# Patient Record
Sex: Female | Born: 1960
Health system: Southern US, Community
[De-identification: ages and names within clinical notes are randomized; demographics above are authoritative.]

## PROBLEM LIST (undated history)

## (undated) DIAGNOSIS — K5792 Diverticulitis of intestine, part unspecified, without perforation or abscess without bleeding: Secondary | ICD-10-CM

## (undated) DIAGNOSIS — T7840XA Allergy, unspecified, initial encounter: Secondary | ICD-10-CM

## (undated) DIAGNOSIS — K219 Gastro-esophageal reflux disease without esophagitis: Secondary | ICD-10-CM

## (undated) DIAGNOSIS — K589 Irritable bowel syndrome without diarrhea: Secondary | ICD-10-CM

## (undated) DIAGNOSIS — T07XXXA Unspecified multiple injuries, initial encounter: Secondary | ICD-10-CM

## (undated) DIAGNOSIS — E739 Lactose intolerance, unspecified: Secondary | ICD-10-CM

## (undated) DIAGNOSIS — K631 Perforation of intestine (nontraumatic): Secondary | ICD-10-CM

## (undated) DIAGNOSIS — R42 Dizziness and giddiness: Secondary | ICD-10-CM

## (undated) DIAGNOSIS — M810 Age-related osteoporosis without current pathological fracture: Secondary | ICD-10-CM

## (undated) DIAGNOSIS — N951 Menopausal and female climacteric states: Secondary | ICD-10-CM

## (undated) DIAGNOSIS — K633 Ulcer of intestine: Secondary | ICD-10-CM

## (undated) HISTORY — DX: Ulcer of intestine: K63.3

## (undated) HISTORY — DX: Perforation of intestine (nontraumatic): K63.1

## (undated) HISTORY — DX: Gastro-esophageal reflux disease without esophagitis: K21.9

## (undated) HISTORY — DX: Dizziness and giddiness: R42

## (undated) HISTORY — DX: Menopausal and female climacteric states: N95.1

## (undated) HISTORY — PX: TONSILLECTOMY: SUR1361

## (undated) HISTORY — DX: Unspecified multiple injuries, initial encounter: T07.XXXA

## (undated) HISTORY — PX: ESOPHAGOGASTRODUODENOSCOPY: SHX1529

## (undated) HISTORY — DX: Age-related osteoporosis without current pathological fracture: M81.0

## (undated) HISTORY — PX: OTHER SURGICAL HISTORY: SHX169

## (undated) HISTORY — DX: Diverticulitis of intestine, part unspecified, without perforation or abscess without bleeding: K57.92

## (undated) HISTORY — DX: Allergy, unspecified, initial encounter: T78.40XA

## (undated) HISTORY — PX: COLONOSCOPY: SHX174

---

## 1998-04-19 ENCOUNTER — Other Ambulatory Visit: Admission: RE | Admit: 1998-04-19 | Discharge: 1998-04-19 | Payer: Self-pay | Admitting: Obstetrics & Gynecology

## 1999-07-08 ENCOUNTER — Other Ambulatory Visit: Admission: RE | Admit: 1999-07-08 | Discharge: 1999-07-08 | Payer: Self-pay | Admitting: Obstetrics & Gynecology

## 2002-06-28 ENCOUNTER — Other Ambulatory Visit: Admission: RE | Admit: 2002-06-28 | Discharge: 2002-06-28 | Payer: Self-pay | Admitting: Obstetrics & Gynecology

## 2002-11-02 ENCOUNTER — Other Ambulatory Visit: Admission: RE | Admit: 2002-11-02 | Discharge: 2002-11-02 | Payer: Self-pay | Admitting: Obstetrics & Gynecology

## 2002-11-14 ENCOUNTER — Encounter: Admission: RE | Admit: 2002-11-14 | Discharge: 2002-11-14 | Payer: Self-pay | Admitting: Orthopedic Surgery

## 2002-11-14 ENCOUNTER — Encounter: Payer: Self-pay | Admitting: Orthopedic Surgery

## 2003-10-16 ENCOUNTER — Other Ambulatory Visit: Admission: RE | Admit: 2003-10-16 | Discharge: 2003-10-16 | Payer: Self-pay | Admitting: Obstetrics and Gynecology

## 2004-12-10 ENCOUNTER — Other Ambulatory Visit: Admission: RE | Admit: 2004-12-10 | Discharge: 2004-12-10 | Payer: Self-pay | Admitting: Obstetrics & Gynecology

## 2010-11-03 ENCOUNTER — Encounter: Payer: Self-pay | Admitting: Gastroenterology

## 2011-08-30 ENCOUNTER — Encounter: Payer: Self-pay | Admitting: *Deleted

## 2011-08-30 ENCOUNTER — Emergency Department (HOSPITAL_COMMUNITY): Payer: BC Managed Care – PPO

## 2011-08-30 ENCOUNTER — Inpatient Hospital Stay (HOSPITAL_COMMUNITY)
Admission: EM | Admit: 2011-08-30 | Discharge: 2011-09-03 | DRG: 188 | Disposition: A | Discharge status: Home / Self Care | Payer: BC Managed Care – PPO | Attending: Internal Medicine | Admitting: Internal Medicine

## 2011-08-30 DIAGNOSIS — R109 Unspecified abdominal pain: Secondary | ICD-10-CM | POA: Diagnosis present

## 2011-08-30 DIAGNOSIS — E876 Hypokalemia: Secondary | ICD-10-CM | POA: Diagnosis present

## 2011-08-30 DIAGNOSIS — K639 Disease of intestine, unspecified: Secondary | ICD-10-CM | POA: Diagnosis present

## 2011-08-30 DIAGNOSIS — N390 Urinary tract infection, site not specified: Secondary | ICD-10-CM | POA: Diagnosis present

## 2011-08-30 DIAGNOSIS — K631 Perforation of intestine (nontraumatic): Principal | ICD-10-CM | POA: Diagnosis present

## 2011-08-30 DIAGNOSIS — K6389 Other specified diseases of intestine: Secondary | ICD-10-CM | POA: Diagnosis present

## 2011-08-30 HISTORY — DX: Lactose intolerance, unspecified: E73.9

## 2011-08-30 HISTORY — DX: Irritable bowel syndrome, unspecified: K58.9

## 2011-08-30 LAB — URINALYSIS, ROUTINE W REFLEX MICROSCOPIC
Bilirubin Urine: NEGATIVE
Glucose, UA: NEGATIVE mg/dL
Hgb urine dipstick: NEGATIVE
Ketones, ur: NEGATIVE mg/dL
Protein, ur: NEGATIVE mg/dL
Specific Gravity, Urine: 1.013 (ref 1.005–1.030)
Urobilinogen, UA: 1 mg/dL (ref 0.0–1.0)
pH: 7 (ref 5.0–8.0)

## 2011-08-30 LAB — DIFFERENTIAL
Basophils Absolute: 0 10*3/uL (ref 0.0–0.1)
Eosinophils Relative: 2 % (ref 0–5)
Lymphocytes Relative: 15 % (ref 12–46)
Monocytes Absolute: 0.9 10*3/uL (ref 0.1–1.0)
Monocytes Relative: 9 % (ref 3–12)

## 2011-08-30 LAB — BASIC METABOLIC PANEL
BUN: 10 mg/dL (ref 6–23)
CO2: 29 mEq/L (ref 19–32)
Calcium: 9.7 mg/dL (ref 8.4–10.5)
Chloride: 99 mEq/L (ref 96–112)
Creatinine, Ser: 0.52 mg/dL (ref 0.50–1.10)

## 2011-08-30 LAB — CBC
HCT: 39.5 % (ref 36.0–46.0)
Hemoglobin: 13.4 g/dL (ref 12.0–15.0)
MCHC: 33.9 g/dL (ref 30.0–36.0)
MCV: 92.1 fL (ref 78.0–100.0)
RDW: 12.6 % (ref 11.5–15.5)
WBC: 10 10*3/uL (ref 4.0–10.5)

## 2011-08-30 LAB — URINE MICROSCOPIC-ADD ON

## 2011-08-30 LAB — POCT PREGNANCY, URINE: Preg Test, Ur: NEGATIVE

## 2011-08-30 MED ORDER — POTASSIUM CHLORIDE 10 MEQ/100ML IV SOLN: 10.0000 meq | INTRAVENOUS | Status: AC

## 2011-08-30 MED ORDER — SODIUM CHLORIDE 0.9 % IV SOLN
INTRAVENOUS | Status: DC
  Administered 2011-08-30: 18:00:00 via INTRAVENOUS

## 2011-08-30 MED ORDER — CIPROFLOXACIN IN D5W 400 MG/200ML IV SOLN
400.0000 mg | Freq: Two times a day (BID) | INTRAVENOUS | Status: DC
  Administered 2011-08-30 – 2011-09-02 (×6): 400 mg via INTRAVENOUS
  Filled 2011-08-30 (×6): qty 200

## 2011-08-30 MED ORDER — IOHEXOL 300 MG/ML  SOLN
80.0000 mL | Freq: Once | INTRAMUSCULAR | Status: AC | PRN
  Administered 2011-08-30: 80 mL via INTRAVENOUS

## 2011-08-30 MED ORDER — METRONIDAZOLE IN NACL 5-0.79 MG/ML-% IV SOLN
500.0000 mg | Freq: Three times a day (TID) | INTRAVENOUS | Status: DC
  Administered 2011-08-30 – 2011-09-02 (×8): 500 mg via INTRAVENOUS
  Filled 2011-08-30 (×9): qty 100

## 2011-08-30 MED ORDER — MORPHINE SULFATE 2 MG/ML IJ SOLN
2.0000 mg | INTRAMUSCULAR | Status: DC | PRN
  Administered 2011-08-30: 2 mg via INTRAVENOUS
  Filled 2011-08-30: qty 1

## 2011-08-30 NOTE — ED Notes (Signed)
MD at bedside. 

## 2011-08-30 NOTE — ED Provider Notes (Signed)
History     CSN: [id number] Arrival date & time: 08/30/2011  4:11 PM   First MD Initiated Contact with Patient 08/30/11 1701      Chief Complaint  Patient presents with  . Abdominal Pain    (Consider location/radiation/quality/duration/timing/severity/associated sxs/prior treatment) Patient is a 50 y.o. female presenting with abdominal pain.  Abdominal Pain The primary symptoms of the illness include abdominal pain. The current episode started 2 days ago. The onset of the illness was gradual. The problem has not changed since onset. Associated with: There was no provocation. The patient states that she believes she is currently not pregnant. The patient has not had a change in bowel habit. Additional symptoms associated with the illness include anorexia. Symptoms associated with the illness do not include chills, heartburn, urgency, hematuria, frequency or back pain. Significant associated medical issues do not include PUD, GERD, inflammatory bowel disease or HIV. Associated medical issues comments: She has chronic gastric discomfort, and frequent stooling. She has been evaluated by GI in the past for same.   Patient's symptoms have been ongoing for several days and persistent in nature. She ate well until today and has somewhat decreased appetite today. She had a missed period in September then had a period in October and feels like she is going through her early symptoms of menopause. There is no vaginal discharge.  Past Medical History  Diagnosis Date  . Irritable bowel   . Lactose intolerance     Past Surgical History  Procedure Date  . Cesarean section 1987    Family History  Problem Relation Age of Onset  . Coronary artery disease Mother   . Ovarian cancer Mother   . Stroke Father   . Breast cancer Sister     History  Substance Use Topics  . Smoking status: Never Smoker   . Smokeless tobacco: Not on file  . Alcohol Use: 2.4 oz/week    4 Glasses of wine per week     OB History    Grav Para Term Preterm Abortions TAB SAB Ect Mult Living                  Review of Systems  Constitutional: Negative for chills.  Gastrointestinal: Positive for abdominal pain and anorexia. Negative for heartburn.  Genitourinary: Negative for urgency, frequency and hematuria.  Musculoskeletal: Negative for back pain.  All other systems reviewed and are negative.    Allergies  Biaxin  Home Medications   No current outpatient prescriptions on file.  BP 123/76  Pulse 70  Temp(Src) 98.5 F (36.9 C) (Oral)  Resp 18  Ht 5\' 4"  (1.626 m)  Wt 121 lb 6.4 oz (55.067 kg)  BMI 20.84 kg/m2  SpO2 95%  Physical Exam  Nursing note and vitals reviewed. Constitutional: She is oriented to person, place, and time. She appears well-developed and well-nourished.  HENT:  Head: Normocephalic and atraumatic.  Eyes: Conjunctivae and EOM are normal. Pupils are equal, round, and reactive to light.  Neck: Normal range of motion. Neck supple.  Cardiovascular: Normal rate and regular rhythm.   Pulmonary/Chest: Effort normal.  Abdominal: Soft. She exhibits no distension. There is tenderness (Moderate right lower quadrant tenderness. No rebound, tenderness.).  Musculoskeletal: Normal range of motion. She exhibits no edema and no tenderness.  Neurological: She is alert and oriented to person, place, and time.  Skin: Skin is warm and dry.  Psychiatric: She has a normal mood and affect. Her behavior is normal. Judgment and thought content normal.  ED Course  Procedures (including critical care time)  Labs Reviewed  URINALYSIS, ROUTINE W REFLEX MICROSCOPIC - Abnormal; Notable for the following:    Appearance CLOUDY (*)    Leukocytes, UA MODERATE (*)    All other components within normal limits  URINE MICROSCOPIC-ADD ON - Abnormal; Notable for the following:    Squamous Epithelial / LPF FEW (*)    Bacteria, UA FEW (*)    All other components within normal limits  BASIC  METABOLIC PANEL - Abnormal; Notable for the following:    Potassium 3.4 (*)    All other components within normal limits  POCT PREGNANCY, URINE  CBC  DIFFERENTIAL  URINE CULTURE  MAGNESIUM  PHOSPHORUS  COMPREHENSIVE METABOLIC PANEL  CBC  URINE CULTURE  TYPE AND SCREEN   Ct Abdomen Pelvis W Contrast  08/30/2011  *RADIOLOGY REPORT*  Clinical Data: Right-sided abdominal pain  CT ABDOMEN AND PELVIS WITH CONTRAST  Technique:  Multidetector CT imaging of the abdomen and pelvis was performed following the standard protocol during bolus administration of intravenous contrast.  Contrast: 80mL OMNIPAQUE IOHEXOL 300 MG/ML IV SOLN  Comparison: None.  Findings: Circumferential thickening of the mid right colon.  This has an apple core appearance and is suspicious for carcinoma. There is stranding of the pericolonic fat which may be due to tumor spread or perforation.  This  does not appear to  represent diverticulitis.  This is 5 centimeters above the ileocecal valve. Negative for appendicitis.  Moderate free fluid may represent be due to colonic perforation. No well-defined abscess.  Small low-density lesions in the left lobe liver are indeterminate but likely cysts.  No other liver lesions.  Gallbladder is normal. Pancreas and spleen are normal.  The kidneys show no obstruction or mass.  3 mm nonobstructing stone right lower pole.  IMPRESSION: Circumferential soft tissue mass involving the mid right colon suspicious for carcinoma.  This may have perforated with stranding in the pericolonic fat and free fluid.  Original Report Authenticated By: Camelia Phenes, M.D.     1. Abdominal pain   2. Colon perforation   3. Colonic mass   4. Hypokalemia   5. UTI (lower urinary tract infection)       MDM  Abdominal pain, secondary to colonic perforation. Exact etiology of perforation is unclear. Differential  diagnoses of possible cancer and other causes as discussed with the patient. Patient needs admission  for stabilization and definitive diagnosis.        Flint Melter, MD 08/31/11 [phone]

## 2011-08-30 NOTE — ED Notes (Signed)
Pt undressed and placed in a gown  

## 2011-08-30 NOTE — ED Notes (Signed)
Patient with lower left abdominal pain since Thursday and is not going away.  Patient states that she usually have gas problems but they normally go away but not this time.  Denies any diarrhea but has been burping

## 2011-08-30 NOTE — ED Notes (Signed)
Family at bedside. 

## 2011-08-30 NOTE — ED Notes (Signed)
Starting PO contrast for CT

## 2011-08-30 NOTE — H&P (Signed)
PCP:   No primary provider on file.  GI: Dr. Kinnie Scales  Chief Complaint:  Abdominal  HPI: Stephanie Giles is a 50 y.o. female   has a past medical history of Irritable bowel and Lactose intolerance.   Presented with  3 day history of abdominal pain and occasional chills decreased by mouth intake. Patient started to have pain on Thursday and her right lower quadrant she first thought it was gas. She  presented to emergency department for observation since she started to have some chills. A CT scan done in emergency department found a right side colon mass suspicious for carcinoma with microperforation and some stranding. Dr. Lindie Spruce was consulted and stated that she did not need emergent operation will admit to medicine and he will consult. Appreciate his assistance.  Review of Systems:    Pertinent Positives: Abdominal pain and chills, anorexia  Pertinent Negatives: No weight loss no chest pains or shortness of breath no fevers no diarrhea no nausea no vomiting no localizing neurological complaints Otherwise ROS are negative except for above, 10 systems were reviewed  Past Medical History: Past Medical History  Diagnosis Date  . Irritable bowel   . Lactose intolerance    Past Surgical History  Procedure Date  . Cesarean section 1987     Medications: Prior to Admission medications   Medication Sig Start Date End Date Taking? Authorizing Provider  ALPRAZolam Prudy Feeler) 0.5 MG tablet Take 0.5 mg by mouth daily as needed. For anxiety    Yes Historical Provider, MD  loratadine (CLARITIN) 10 MG tablet Take 10 mg by mouth daily.     Yes Historical Provider, MD  vitamin C (ASCORBIC ACID) 500 MG tablet Take 500 mg by mouth daily.     Yes Historical Provider, MD  Multiple Vitamins-Minerals (MULTIVITAMINS THER. W/MINERALS) TABS Take 1 tablet by mouth daily.      Historical Provider, MD    Allergies:   Allergies  Allergen Reactions  . Biaxin     Social History:  reports that she has  never smoked. She does not have any smokeless tobacco history on file. She reports that she drinks about 2.4 ounces of alcohol per week.   Family History: family history includes Breast cancer in her sister; Coronary artery disease in her mother; Ovarian cancer in her mother; and Stroke in her father.    Physical Exam: Patient Vitals for the past 24 hrs:  BP Temp Temp src Pulse Resp SpO2  08/30/11 2215 123/74 mmHg - - 76  - 98 %  08/30/11 2115 119/81 mmHg - - 71  - 98 %  08/30/11 1959 121/77 mmHg 98.9 F (37.2 C) Oral - 77  99 %  08/30/11 1855 120/80 mmHg - - 80  - 100 %  08/30/11 1611 121/78 mmHg 98 F (36.7 C) Oral 85  18  98 %     Alert and Oriented in No Acute distress Mucous Membranes and Skin: Dry mucous membranes normal skin turgor Head Non traumatic, neck supple Heart: Regular rate and rhythm no murmurs rubs or gallops Lungs: Clear to auscultation bilaterally Abdomen: Slight right lower quadrant tenderness no peritoneal signs no rebound no guarding Lower extremities: No edema no cyanosis Neurologically Grossly intact: Moving all 4 extremities equally strength 5 out of 5 in all 4 extremities Skin clean Dry and intact no rash:  body mass index is unknown because there is no height or weight on file.   Labs on Admission:   [facility] 08/30/11 1749  NA  138  K 3.4*  CL 99  CO2 29  GLUCOSE 85  BUN 10  CREATININE 0.52  CALCIUM 9.7  MG --  PHOS --   No results found for this basename: AST:2,ALT:2,ALKPHOS:2,BILITOT:2,PROT:2,ALBUMIN:2 in the last 72 hours No results found for this basename: LIPASE:2,AMYLASE:2 in the last 72 hours  Basename 08/30/11 1749  WBC 10.0  NEUTROABS 7.4  HGB 13.4  HCT 39.5  MCV 92.1  PLT 169   No results found for this basename: CKTOTAL:3,CKMB:3,CKMBINDEX:3,TROPONINI:3 in the last 72 hours No results found for this basename: TSH,T4TOTAL,FREET3,T3FREE,THYROIDAB in the last 72 hours No results found for this basename:  VITAMINB12:2,FOLATE:2,FERRITIN:2,TIBC:2,IRON:2,RETICCTPCT:2 in the last 72 hours No results found for this basename: HGBA1C       Radiological Exams on Admission: Ct Abdomen Pelvis W Contrast  08/30/2011  *RADIOLOGY REPORT*  Clinical Data: Right-sided abdominal pain  CT ABDOMEN AND PELVIS WITH CONTRAST  Technique:  Multidetector CT imaging of the abdomen and pelvis was performed following the standard protocol during bolus administration of intravenous contrast.  Contrast: 80mL OMNIPAQUE IOHEXOL 300 MG/ML IV SOLN  Comparison: None.  Findings: Circumferential thickening of the mid right colon.  This has an apple core appearance and is suspicious for carcinoma. There is stranding of the pericolonic fat which may be due to tumor spread or perforation.  This  does not appear to  represent diverticulitis.  This is 5 centimeters above the ileocecal valve. Negative for appendicitis.  Moderate free fluid may represent be due to colonic perforation. No well-defined abscess.  Small low-density lesions in the left lobe liver are indeterminate but likely cysts.  No other liver lesions.  Gallbladder is normal. Pancreas and spleen are normal.  The kidneys show no obstruction or mass.  3 mm nonobstructing stone right lower pole.  IMPRESSION: Circumferential soft tissue mass involving the mid right colon suspicious for carcinoma.  This may have perforated with stranding in the pericolonic fat and free fluid.  Original Report Authenticated By: Camelia Phenes, M.D.    Assessment/Plan  Present on Admission:  .Colon perforation - as per General surgery, will keep n.p.o. started on Cipro and Flagyl give IV fluid followup clinically at this point patient is hemodynamically stable  .Abdominal pain - likely secondary to above , give more for the pain  .Colonic mass - the patient will need GI consult for possible colonoscopy and biopsy  .Hypokalemia - replace in followup  .UTI (lower urinary tract infection) - Will cover  with Cipro weight urine culture results   Prophylaxis: -SCDs, Protonix    Eaden Hettinger 08/30/2011, 10:41 PM

## 2011-08-30 NOTE — ED Notes (Signed)
Pt has completed contrast PO. CT dept notified.

## 2011-08-31 ENCOUNTER — Encounter (HOSPITAL_COMMUNITY): Payer: Self-pay | Admitting: Nurse Practitioner

## 2011-08-31 DIAGNOSIS — R1903 Right lower quadrant abdominal swelling, mass and lump: Secondary | ICD-10-CM

## 2011-08-31 DIAGNOSIS — R933 Abnormal findings on diagnostic imaging of other parts of digestive tract: Secondary | ICD-10-CM

## 2011-08-31 DIAGNOSIS — R1031 Right lower quadrant pain: Secondary | ICD-10-CM

## 2011-08-31 DIAGNOSIS — D490 Neoplasm of unspecified behavior of digestive system: Secondary | ICD-10-CM

## 2011-08-31 LAB — TYPE AND SCREEN: ABO/RH(D): O POS

## 2011-08-31 LAB — COMPREHENSIVE METABOLIC PANEL
Alkaline Phosphatase: 67 U/L (ref 39–117)
BUN: 7 mg/dL (ref 6–23)
Chloride: 107 mEq/L (ref 96–112)
Creatinine, Ser: 0.45 mg/dL — ABNORMAL LOW (ref 0.50–1.10)
GFR calc Af Amer: >90 mL/min (ref >90)
GFR calc non Af Amer: >90 mL/min (ref >90)
Glucose, Bld: 91 mg/dL (ref 70–99)
Potassium: 4 mEq/L (ref 3.5–5.1)
Total Bilirubin: 0.7 mg/dL (ref 0.3–1.2)

## 2011-08-31 LAB — ABO/RH: ABO/RH(D): O POS

## 2011-08-31 LAB — CBC
Platelets: 146 10*3/uL — ABNORMAL LOW (ref 150–400)
RDW: 12.6 % (ref 11.5–15.5)
WBC: 6.3 10*3/uL (ref 4.0–10.5)

## 2011-08-31 MED ORDER — PEG-KCL-NACL-NASULF-NA ASC-C 100 G PO SOLR
1.0000 | Freq: Once | ORAL | Status: AC
  Administered 2011-08-31: 100 g via ORAL
  Filled 2011-08-31: qty 1

## 2011-08-31 MED ORDER — SODIUM CHLORIDE 0.9 % IV SOLN
INTRAVENOUS | Status: DC
  Administered 2011-08-31 – 2011-09-03 (×6): via INTRAVENOUS

## 2011-08-31 MED ORDER — LORAZEPAM 2 MG/ML IJ SOLN
0.5000 mg | Freq: Four times a day (QID) | INTRAMUSCULAR | Status: DC | PRN
  Administered 2011-08-31 – 2011-09-02 (×3): 0.5 mg via INTRAVENOUS
  Filled 2011-08-31 (×3): qty 1

## 2011-08-31 MED ORDER — SENNOSIDES-DOCUSATE SODIUM 8.6-50 MG PO TABS
1.0000 | ORAL_TABLET | Freq: Every day | ORAL | Status: DC | PRN
  Filled 2011-08-31: qty 1

## 2011-08-31 MED ORDER — ONDANSETRON HCL 4 MG/2ML IJ SOLN
4.0000 mg | Freq: Four times a day (QID) | INTRAMUSCULAR | Status: DC | PRN
  Administered 2011-08-31: 4 mg via INTRAVENOUS
  Filled 2011-08-31: qty 2

## 2011-08-31 MED ORDER — PANTOPRAZOLE SODIUM 40 MG IV SOLR
40.0000 mg | Freq: Every day | INTRAVENOUS | Status: DC
  Administered 2011-08-31 – 2011-09-01 (×2): 40 mg via INTRAVENOUS
  Filled 2011-08-31 (×3): qty 40

## 2011-08-31 MED ORDER — ALBUTEROL SULFATE (5 MG/ML) 0.5% IN NEBU: 2.5000 mg | INHALATION_SOLUTION | RESPIRATORY_TRACT | Status: DC | PRN

## 2011-08-31 MED ORDER — ONDANSETRON HCL 4 MG PO TABS: 4.0000 mg | ORAL_TABLET | Freq: Four times a day (QID) | ORAL | Status: DC | PRN

## 2011-08-31 MED ORDER — MORPHINE SULFATE 4 MG/ML IJ SOLN
2.0000 mg | INTRAMUSCULAR | Status: DC | PRN
  Administered 2011-09-01 (×2): 2 mg via INTRAVENOUS
  Filled 2011-08-31 (×2): qty 1

## 2011-08-31 MED ORDER — POTASSIUM CHLORIDE 10 MEQ/100ML IV SOLN
10.0000 meq | INTRAVENOUS | Status: AC
  Administered 2011-08-31 (×2): 10 meq via INTRAVENOUS
  Filled 2011-08-31 (×2): qty 100

## 2011-08-31 NOTE — Progress Notes (Signed)
[facility] Progress Note:   * No surgery found *  Subjective: Right sided abdominal pain is better.  I discussed colonoscopy with her.  She would like to see Dr. [name].  Will attempt to discuss with her.  Objective: Vital signs in last 24 hours: Temp:  [97.8 F (36.6 C)-98.9 F (37.2 C)] 97.8 F (36.6 C) (11/18 0930) Pulse Rate:  [66-85] 71  (11/18 0930) Resp:  [18-77] 20  (11/18 0930) BP: (116-134)/(71-83) 123/78 mmHg (11/18 0930) SpO2:  [95 %-100 %] 97 % (11/18 0930) Weight:  [121 lb 6.4 oz (55.067 kg)] 121 lb 6.4 oz (55.067 kg) (11/18 0148)  Intake/Output from previous day: 11/17 0701 - 11/18 0700 In: 601.7 [I.V.:401.7; IV Piggyback:200] Out: 100 [Urine:100] Intake/Output this shift:    Physical Exam:  Some residual tenderness on the right of abdomen Lab Results:   [facility] 08/31/11 0630 08/30/11 1749  WBC 6.3 10.0  HGB 11.2* 13.4  HCT 33.8* 39.5  PLT 146* 169   BMET  Basename 08/31/11 0630 08/30/11 1749  NA 141 138  K 4.0 3.4*  CL 107 99  CO2 25 29  GLUCOSE 91 85  BUN 7 10  CREATININE 0.45* 0.52  CALCIUM 8.9 9.7   PT/INR No results found for this basename: LABPROT:2,INR:2 in the last 72 hours Studies/Results: Ct Abdomen Pelvis W Contrast  08/30/2011  *RADIOLOGY REPORT*  Clinical Data: Right-sided abdominal pain  CT ABDOMEN AND PELVIS WITH CONTRAST  Technique:  Multidetector CT imaging of the abdomen and pelvis was performed following the standard protocol during bolus administration of intravenous contrast.  Contrast: 80mL OMNIPAQUE IOHEXOL 300 MG/ML IV SOLN  Comparison: None.  Findings: Circumferential thickening of the mid right colon.  This has an apple core appearance and is suspicious for carcinoma. There is stranding of the pericolonic fat which may be due to tumor spread or perforation.  This  does not appear to  represent diverticulitis.  This is 5 centimeters above the ileocecal valve. Negative for appendicitis.  Moderate free fluid may  represent be due to colonic perforation. No well-defined abscess.  Small low-density lesions in the left lobe liver are indeterminate but likely cysts.  No other liver lesions.  Gallbladder is normal. Pancreas and spleen are normal.  The kidneys show no obstruction or mass.  3 mm nonobstructing stone right lower pole.  IMPRESSION: Circumferential soft tissue mass involving the mid right colon suspicious for carcinoma.  This may have perforated with stranding in the pericolonic fat and free fluid.  Original Report Authenticated By: Camelia Phenes, M.D.   Anti-infectives: Anti-infectives     Start     Dose/Rate Route Frequency Ordered Stop   08/30/11 2145   ciprofloxacin (CIPRO) IVPB 400 mg        400 mg 200 mL/hr over 60 Minutes Intravenous Every 12 hours 08/30/11 2144     08/30/11 2145   metroNIDAZOLE (FLAGYL) IVPB 500 mg        500 mg 100 mL/hr over 60 Minutes Intravenous Every 8 hours 08/30/11 2144            Assessment/Plan: Problem List: Patient Active Problem List  Diagnoses  . Colon perforation  . Abdominal pain  . Colonic mass  . Hypokalemia  . UTI (lower urinary tract infection)    Mass in ascending colon.  Needs colonoscopy.  If cancer will need right hemicolectomy.  CCS will follow.  * No surgery found *    LOS: 1 day  Matt B. Daphine Deutscher, MD, [facility] [phone] beeper [phone]  08/31/2011 11:20 AM

## 2011-08-31 NOTE — Consult Note (Signed)
[facility] Consultation  Referring Provider: No ref. provider foundDr Daphine Deutscher Primary Care Physician:  No primary provider on file. Primary Gastroenterologist:  Dr.  Jaquita Rector for Consultation:  Right lower quadrant abdominal pain, abnormal CT scan of the abdomen suggesting right colon mass  HPI: Stephanie Giles is a 50 y.o. female with 2 day history of right lower quadrant abdominal pain and low-grade temperature. She denies nausea vomiting or change in bowel habits. She has a history of irritable bowel syndrome and underwent upper endoscopy by Dr.Medoff 2 years ago. Her weight has been stable. There is no family history of colon cancer. He denies any rectal bleeding. There is no history of anemia, she admits to lactose intolerance   Past Medical History  Diagnosis Date  . Irritable bowel   . Lactose intolerance     Past Surgical History  Procedure Date  . Cesarean section 1987  . Tonsillectomy     Prior to Admission medications   Medication Sig Start Date End Date Taking? Authorizing Provider  ALPRAZolam Prudy Feeler) 0.5 MG tablet Take 0.5 mg by mouth daily as needed. For anxiety    Yes Historical Provider, MD  loratadine (CLARITIN) 10 MG tablet Take 10 mg by mouth daily.     Yes Historical Provider, MD  vitamin C (ASCORBIC ACID) 500 MG tablet Take 500 mg by mouth daily.     Yes Historical Provider, MD  Multiple Vitamins-Minerals (MULTIVITAMINS THER. W/MINERALS) TABS Take 1 tablet by mouth daily.      Historical Provider, MD    Current Facility-Administered Medications  Medication Dose Route Frequency Provider Last Rate Last Dose  . 0.9 %  sodium chloride infusion   Intravenous Continuous Anastassia Doutova 100 mL/hr at 08/31/11 0219    . albuterol (PROVENTIL) (5 MG/ML) 0.5% nebulizer solution 2.5 mg  2.5 mg Nebulization Q2H PRN Anastassia Doutova      . ciprofloxacin (CIPRO) IVPB 400 mg  400 mg Intravenous Q12H Anastassia Doutova   400 mg at 08/31/11 0908  . iohexol  (OMNIPAQUE) 300 MG/ML injection 80 mL  80 mL Intravenous Once PRN Medication Radiologist   80 mL at 08/30/11 2026  . LORazepam (ATIVAN) injection 0.5 mg  0.5 mg Intravenous Q6H PRN Anastassia Doutova   0.5 mg at 08/31/11 0219  . metroNIDAZOLE (FLAGYL) IVPB 500 mg  500 mg Intravenous Q8H Anastassia Doutova   500 mg at 08/31/11 0718  . morphine 4 MG/ML injection 2 mg  2 mg Intravenous Q4H PRN Hilario Quarry Amend, PHARMD      . ondansetron [facility]) tablet 4 mg  4 mg Oral Q6H PRN Anastassia Doutova       Or  . ondansetron (ZOFRAN) injection 4 mg  4 mg Intravenous Q6H PRN Anastassia Doutova      . pantoprazole (PROTONIX) injection 40 mg  40 mg Intravenous QHS Anastassia Doutova      . potassium chloride 10 mEq in 100 mL IVPB  10 mEq Intravenous Q1 Hr x 2 Anastassia Doutova      . potassium chloride 10 mEq in 100 mL IVPB  10 mEq Intravenous Q1 Hr x 2 Rolan Lipa   10 mEq at 08/31/11 0533  . senna-docusate (Senokot-S) tablet 1 tablet  1 tablet Oral Daily PRN Anastassia Doutova      . DISCONTD: 0.9 %  sodium chloride infusion   Intravenous Continuous Flint Melter, MD 125 mL/hr at 08/30/11 1804    . DISCONTD: morphine 2 MG/ML injection 2 mg  2 mg Intravenous  Q4H PRN Anastassia Doutova   2 mg at 08/30/11 2154    Allergies as of 08/30/2011 - Review Complete 08/30/2011  Allergen Reaction Noted  . Biaxin  08/30/2011    Family History  Problem Relation Age of Onset  . Coronary artery disease Mother   . Ovarian cancer Mother   . Stroke Father   . Breast cancer Sister     History   Social History  . Marital Status: Married    Spouse Name: N/A    Number of Children: N/A  . Years of Education: N/A   Occupational History  . Not on file.   Social History Main Topics  . Smoking status: Never Smoker   . Smokeless tobacco: Never Used  . Alcohol Use: 2.4 oz/week    4 Glasses of wine per week  . Drug Use: No  . Sexually Active: Yes    Birth Control/ Protection: None   Other  Topics Concern  . Not on file   Social History Narrative  . No narrative on file    Review of Systems: Gen: Denies any fever, chills, sweats, anorexia, fatigue, weakness, malaise, weight loss, and sleep disorder CV: Denies chest pain, angina, palpitations, syncope, orthopnea, PND, peripheral edema, and claudication. Resp: Denies dyspnea at rest, dyspnea with exercise, cough, sputum, wheezing, coughing up blood, and pleurisy. GI: Denies vomiting blood, jaundice, and fecal incontinence.   Denies dysphagia or odynophagia., Positive for low abdominal pain predominantly in right lower quadrant GU : Denies urinary burning, blood in urine, urinary frequency, urinary hesitancy, nocturnal urination, and urinary incontinence. MS: Denies joint pain, limitation of movement, and swelling, stiffness, low back pain, right hip pain has been only recently Denies muscle weakness, cramps, atrophy.  Derm: Denies rash, itching, dry skin, hives, moles, warts, or unhealing ulcers.  Psych: Denies depression, anxiety, memory loss, suicidal ideation, hallucinations, paranoia, and confusion. Heme: Denies bruising, bleeding, and enlarged lymph nodes. Neuro:  Denies any headaches, dizziness, paresthesias. Endo:  Denies any problems with DM, thyroid, adrenal function.  Physical Exam: Vital signs in last 24 hours: Temp:  [97.8 F (36.6 C)-98.9 F (37.2 C)] 97.8 F (36.6 C) (11/18 0930) Pulse Rate:  [66-85] 71  (11/18 0930) Resp:  [18-77] 20  (11/18 0930) BP: (116-134)/(71-83) 123/78 mmHg (11/18 0930) SpO2:  [95 %-100 %] 97 % (11/18 0930) Weight:  [121 lb 6.4 oz (55.067 kg)] 121 lb 6.4 oz (55.067 kg) (11/18 0148) Last BM Date: 08/30/11 General:   Alert,  Well-developed, well-nourished, pleasant and cooperative in NAD Head:  Normocephalic and atraumatic. Eyes:  Sclera clear, no icterus.   Conjunctiva pink. Ears:  Normal auditory acuity. Nose:  No deformity, discharge,  or lesions. Mouth:  No deformity or  lesions.  Oropharynx pink & moist. Neck:  Supple; no masses or thyromegaly. Lungs:  Clear throughout to auscultation.   No wheezes, crackles, or rhonchi. No acute distress. Heart:  Regular rate and rhythm; no murmurs, clicks, rubs,  or gallops. Abdomen: Soft abdomen with decreased bowel sounds. Marked tenderness in the right lower quadrant without rebound. No palpable mass. Liver at costal margin. No fluid wave. No CVA tenderness. Normal bowel sounds, without guarding, and without rebound.   Rectal:  Deferred until time of colonoscopy.   Msk:  Symmetrical without gross deformities. Normal posture. Pulses:  Normal pulses noted. Extremities:  Without clubbing or edema. Neurologic:  Alert and  oriented x4;  grossly normal neurologically. Skin:  Intact without significant lesions or rashes. Cervical Nodes:  No significant  cervical adenopathy. Psych:  Alert and cooperative. Normal mood and affect.  Intake/Output from previous day: 11/17 0701 - 11/18 0700 In: 601.7 [I.V.:401.7; IV Piggyback:200] Out: 100 [Urine:100] Intake/Output this shift:    Lab Results:  Basename 08/31/11 0630 08/30/11 1749  WBC 6.3 10.0  HGB 11.2* 13.4  HCT 33.8* 39.5  PLT 146* 169   BMET  Basename 08/31/11 0630 08/30/11 1749  NA 141 138  K 4.0 3.4*  CL 107 99  CO2 25 29  GLUCOSE 91 85  BUN 7 10  CREATININE 0.45* 0.52  CALCIUM 8.9 9.7   LFT  Basename 08/31/11 0630  PROT 6.5  ALBUMIN 3.1*  AST 128*  ALT 208*  ALKPHOS 67  BILITOT 0.7  BILIDIR --  IBILI --   PT/INR No results found for this basename: LABPROT:2,INR:2 in the last 72 hours    Studies/Results: Ct Abdomen Pelvis W Contrast  08/30/2011  *RADIOLOGY REPORT*  Clinical Data: Right-sided abdominal pain  CT ABDOMEN AND PELVIS WITH CONTRAST  Technique:  Multidetector CT imaging of the abdomen and pelvis was performed following the standard protocol during bolus administration of intravenous contrast.  Contrast: 80mL OMNIPAQUE IOHEXOL 300  MG/ML IV SOLN  Comparison: None.  Findings: Circumferential thickening of the mid right colon.  This has an apple core appearance and is suspicious for carcinoma. There is stranding of the pericolonic fat which may be due to tumor spread or perforation.  This  does not appear to  represent diverticulitis.  This is 5 centimeters above the ileocecal valve. Negative for appendicitis.  Moderate free fluid may represent be due to colonic perforation. No well-defined abscess.  Small low-density lesions in the left lobe liver are indeterminate but likely cysts.  No other liver lesions.  Gallbladder is normal. Pancreas and spleen are normal.  The kidneys show no obstruction or mass.  3 mm nonobstructing stone right lower pole.  IMPRESSION: Circumferential soft tissue mass involving the mid right colon suspicious for carcinoma.  This may have perforated with stranding in the pericolonic fat and free fluid.  Original Report Authenticated By: Camelia Phenes, M.D.     Previous Endoscopies: C. history of present illness  Impression / Plan: 50 year old white female with him mass in the ascending colon consistent with the apple core lesion. She has no symptoms of obstruction. There is a mild edema surrounding the lesion suggesting inflammatory response. No free air. Small amount of pelvic fluid suggest inflammatory response but no perforation. 2 small lesions in the liver most likely not significant. We have  discussed differential diagnosis which is most likely a malignant tumor of the right colon, less likely lymphoma or carcinoid tumor. Less likely Crohn's disease We will proceed with gentle prep today and diagnostic colonoscopy and biopsy tomorrow. I have discussed possibly resection of that Dr. Daphine Deutscher. We will check a CEA level today and order the colonoscopy prep    LOS: 1 day   [name]  08/31/2011, 2:26 PM

## 2011-08-31 NOTE — Progress Notes (Signed)
Subjective: Patient seen and examined . She denies any abdominal pain at the moment. No nausea or vomiting, no diarrhea.  Objective: Vital signs in last 24 hours: Temp:  [97.8 F (36.6 C)-98.9 F (37.2 C)] 97.8 F (36.6 C) (11/18 0930) Pulse Rate:  [66-85] 71  (11/18 0930) Resp:  [18-77] 20  (11/18 0930) BP: (116-134)/(71-83) 123/78 mmHg (11/18 0930) SpO2:  [95 %-100 %] 97 % (11/18 0930) Weight:  [55.067 kg (121 lb 6.4 oz)] 121 lb 6.4 oz (55.067 kg) (11/18 0148) Weight change:  Last BM Date: 08/30/11  Intake/Output from previous day: 11/17 0701 - 11/18 0700 In: 601.7 [I.V.:401.7; IV Piggyback:200] Out: 100 [Urine:100]     Physical Exam: General: Alert, awake, oriented x3, in no acute distress. Heart: Regular rate and rhythm, without murmurs, rubs, gallops. Lungs: Clear to auscultation bilaterally. Abdomen: Soft, nontender, nondistended, positive bowel sounds. Extremities: No clubbing cyanosis or edema with positive pedal pulses. Neuro: Grossly intact, nonfocal.    Lab Results: Results for orders placed during the hospital encounter of 08/30/11 (from the past 24 hour(s))  POCT PREGNANCY, URINE     Status: Normal   Collection Time   08/30/11  4:46 PM      Component Value Range   Preg Test, Ur NEGATIVE    URINALYSIS, ROUTINE W REFLEX MICROSCOPIC     Status: Abnormal   Collection Time   08/30/11  4:51 PM      Component Value Range   Color, Urine YELLOW  YELLOW    Appearance CLOUDY (*) CLEAR    Specific Gravity, Urine 1.013  1.005 - 1.030    pH 7.0  5.0 - 8.0    Glucose, UA NEGATIVE  NEGATIVE (mg/dL)   Hgb urine dipstick NEGATIVE  NEGATIVE    Bilirubin Urine NEGATIVE  NEGATIVE    Ketones, ur NEGATIVE  NEGATIVE (mg/dL)   Protein, ur NEGATIVE  NEGATIVE (mg/dL)   Urobilinogen, UA 1.0  0.0 - 1.0 (mg/dL)   Nitrite NEGATIVE  NEGATIVE    Leukocytes, UA MODERATE (*) NEGATIVE   URINE MICROSCOPIC-ADD ON     Status: Abnormal   Collection Time   08/30/11  4:51 PM   Component Value Range   Squamous Epithelial / LPF FEW (*) RARE    WBC, UA 7-10  <3 (WBC/hpf)   Bacteria, UA FEW (*) RARE    Urine-Other MUCOUS PRESENT    CBC     Status: Normal   Collection Time   08/30/11  5:49 PM      Component Value Range   WBC 10.0  4.0 - 10.5 (K/uL)   RBC 4.29  3.87 - 5.11 (MIL/uL)   Hemoglobin 13.4  12.0 - 15.0 (g/dL)   HCT 39.5  36.0 - 46.0 (%)   MCV 92.1  78.0 - 100.0 (fL)   MCH 31.2  26.0 - 34.0 (pg)   MCHC 33.9  30.0 - 36.0 (g/dL)   RDW 12.6  11.5 - 15.5 (%)   Platelets 169  150 - 400 (K/uL)  DIFFERENTIAL     Status: Normal   Collection Time   08/30/11  5:49 PM      Component Value Range   Neutrophils Relative 75  43 - 77 (%)   Neutro Abs 7.4  1.7 - 7.7 (K/uL)   Lymphocytes Relative 15  12 - 46 (%)   Lymphs Abs 1.5  0.7 - 4.0 (K/uL)   Monocytes Relative 9  3 - 12 (%)   Monocytes Absolute 0.9  0.1 -   1.0 (K/uL)   Eosinophils Relative 2  0 - 5 (%)   Eosinophils Absolute 0.2  0.0 - 0.7 (K/uL)   Basophils Relative 0  0 - 1 (%)   Basophils Absolute 0.0  0.0 - 0.1 (K/uL)  BASIC METABOLIC PANEL     Status: Abnormal   Collection Time   08/30/11  5:49 PM      Component Value Range   Sodium 138  135 - 145 (mEq/L)   Potassium 3.4 (*) 3.5 - 5.1 (mEq/L)   Chloride 99  96 - 112 (mEq/L)   CO2 29  19 - 32 (mEq/L)   Glucose, Bld 85  70 - 99 (mg/dL)   BUN 10  6 - 23 (mg/dL)   Creatinine, Ser 0.52  0.50 - 1.10 (mg/dL)   Calcium 9.7  8.4 - 10.5 (mg/dL)   GFR calc non Af Amer >90  >90 (mL/min)   GFR calc Af Amer >90  >90 (mL/min)  MAGNESIUM     Status: Normal   Collection Time   08/31/11  5:30 AM      Component Value Range   Magnesium 2.0  1.5 - 2.5 (mg/dL)  PHOSPHORUS     Status: Normal   Collection Time   08/31/11  5:30 AM      Component Value Range   Phosphorus 3.1  2.3 - 4.6 (mg/dL)  COMPREHENSIVE METABOLIC PANEL     Status: Abnormal   Collection Time   08/31/11  6:30 AM      Component Value Range   Sodium 141  135 - 145 (mEq/L)   Potassium 4.0   3.5 - 5.1 (mEq/L)   Chloride 107  96 - 112 (mEq/L)   CO2 25  19 - 32 (mEq/L)   Glucose, Bld 91  70 - 99 (mg/dL)   BUN 7  6 - 23 (mg/dL)   Creatinine, Ser 0.45 (*) 0.50 - 1.10 (mg/dL)   Calcium 8.9  8.4 - 10.5 (mg/dL)   Total Protein 6.5  6.0 - 8.3 (g/dL)   Albumin 3.1 (*) 3.5 - 5.2 (g/dL)   AST 128 (*) 0 - 37 (U/L)   ALT 208 (*) 0 - 35 (U/L)   Alkaline Phosphatase 67  39 - 117 (U/L)   Total Bilirubin 0.7  0.3 - 1.2 (mg/dL)   GFR calc non Af Amer >90  >90 (mL/min)   GFR calc Af Amer >90  >90 (mL/min)  CBC     Status: Abnormal   Collection Time   08/31/11  6:30 AM      Component Value Range   WBC 6.3  4.0 - 10.5 (K/uL)   RBC 3.70 (*) 3.87 - 5.11 (MIL/uL)   Hemoglobin 11.2 (*) 12.0 - 15.0 (g/dL)   HCT 33.8 (*) 36.0 - 46.0 (%)   MCV 91.4  78.0 - 100.0 (fL)   MCH 30.3  26.0 - 34.0 (pg)   MCHC 33.1  30.0 - 36.0 (g/dL)   RDW 12.6  11.5 - 15.5 (%)   Platelets 146 (*) 150 - 400 (K/uL)  TYPE AND SCREEN     Status: Normal   Collection Time   08/31/11  7:30 AM      Component Value Range   ABO/RH(D) O POS     Antibody Screen NEG     Sample Expiration 09/03/2011      Studies/Results: Ct Abdomen Pelvis W Contrast .  IMPRESSION: Circumferential soft tissue mass involving the mid right colon suspicious for carcinoma.  This may have   perforated with stranding in the pericolonic fat and free fluid.  Original Report Authenticated By: DAVID C. CLARK, M.D.    Medications:    . ciprofloxacin  400 mg Intravenous Q12H  . metronidazole  500 mg Intravenous Q8H  . pantoprazole (PROTONIX) IV  40 mg Intravenous QHS  . potassium chloride  10 mEq Intravenous Q1 Hr x 2  . potassium chloride  10 mEq Intravenous Q1 Hr x 2    albuterol, iohexol, LORazepam, morphine injection, ondansetron (ZOFRAN) IV, ondansetron, senna-docusate, DISCONTD:  morphine injection     . sodium chloride 100 mL/hr at 08/31/11 0219  . DISCONTD: sodium chloride 125 mL/hr at 08/30/11 1804    Assessment/Plan:  Principal  Problem:  *Colon mass/perforation Appreciate CCS input. GI was consulted. Continue with ciprofloxacin and Flagyl for now. Active Problems:  Abdominal pain Secondary to the above. Resolved.  Hypokalemia Repleted.  UTI (lower urinary tract infection) Continue antibiotics, follow cultures.    LOS: 1 day   Elba Schaber 08/31/2011, 12:02 PM  

## 2011-09-01 ENCOUNTER — Encounter (HOSPITAL_COMMUNITY): Payer: Self-pay | Admitting: Internal Medicine

## 2011-09-01 ENCOUNTER — Encounter (HOSPITAL_COMMUNITY)
Admission: EM | Disposition: A | Discharge status: Home / Self Care | Payer: Self-pay | Source: Home / Self Care | Attending: Internal Medicine

## 2011-09-01 ENCOUNTER — Other Ambulatory Visit: Payer: Self-pay | Admitting: Internal Medicine

## 2011-09-01 HISTORY — PX: COLONOSCOPY: SHX5424

## 2011-09-01 LAB — URINE CULTURE
Colony Count: 35000
Culture  Setup Time: 201211180136
Culture  Setup Time: 201211181812

## 2011-09-01 LAB — COMPREHENSIVE METABOLIC PANEL
ALT: 145 U/L — ABNORMAL HIGH (ref 0–35)
AST: 56 U/L — ABNORMAL HIGH (ref 0–37)
CO2: 23 mEq/L (ref 19–32)
Calcium: 8.8 mg/dL (ref 8.4–10.5)
GFR calc non Af Amer: >90 mL/min (ref >90)
Potassium: 3.6 mEq/L (ref 3.5–5.1)
Sodium: 141 mEq/L (ref 135–145)

## 2011-09-01 SURGERY — COLONOSCOPY
Anesthesia: Moderate Sedation

## 2011-09-01 MED ORDER — FENTANYL NICU IV SYRINGE 50 MCG/ML
INJECTION | INTRAMUSCULAR | Status: DC | PRN
  Administered 2011-09-01 (×4): 25 ug via INTRAVENOUS

## 2011-09-01 MED ORDER — FENTANYL CITRATE 0.05 MG/ML IJ SOLN
INTRAMUSCULAR | Status: AC
  Filled 2011-09-01: qty 2

## 2011-09-01 MED ORDER — MIDAZOLAM HCL 5 MG/5ML IJ SOLN
INTRAMUSCULAR | Status: DC | PRN
  Administered 2011-09-01: 2 mg via INTRAVENOUS
  Administered 2011-09-01: 1 mg via INTRAVENOUS
  Administered 2011-09-01 (×2): 2 mg via INTRAVENOUS

## 2011-09-01 MED ORDER — ONDANSETRON HCL 4 MG/2ML IJ SOLN
4.0000 mg | Freq: Four times a day (QID) | INTRAMUSCULAR | Status: DC | PRN
  Administered 2011-09-01 (×2): 4 mg via INTRAVENOUS
  Filled 2011-09-01 (×2): qty 2

## 2011-09-01 MED ORDER — MIDAZOLAM HCL 10 MG/2ML IJ SOLN
INTRAMUSCULAR | Status: AC
  Filled 2011-09-01: qty 2

## 2011-09-01 MED ORDER — ACETAMINOPHEN 160 MG/5ML PO SOLN
650.0000 mg | Freq: Four times a day (QID) | ORAL | Status: DC | PRN
  Administered 2011-09-01: 650 mg via ORAL
  Filled 2011-09-01: qty 20.3

## 2011-09-01 NOTE — Interval H&P Note (Signed)
History and Physical Interval Note:   09/01/2011   8:59 AM   Stephanie Giles  has presented today for surgery, with the diagnosis of colon mass  The various methods of treatment have been discussed with the patient and family. After consideration of risks, benefits and other options for treatment, the patient has consented to  Procedure(s): COLONOSCOPY as a surgical intervention .  The patients' history has been reviewed, patient examined, no change in status, stable for surgery.  I have reviewed the patients' chart and labs.  Questions were answered to the patient's satisfaction.     [name]  MD

## 2011-09-01 NOTE — H&P (View-Only) (Signed)
Subjective: Patient seen and examined . She denies any abdominal pain at the moment. No nausea or vomiting, no diarrhea.  Objective: Vital signs in last 24 hours: Temp:  [97.8 F (36.6 C)-98.9 F (37.2 C)] 97.8 F (36.6 C) (11/18 0930) Pulse Rate:  [66-85] 71  (11/18 0930) Resp:  [18-77] 20  (11/18 0930) BP: (116-134)/(71-83) 123/78 mmHg (11/18 0930) SpO2:  [95 %-100 %] 97 % (11/18 0930) Weight:  [55.067 kg (121 lb 6.4 oz)] 121 lb 6.4 oz (55.067 kg) (11/18 0148) Weight change:  Last BM Date: 08/30/11  Intake/Output from previous day: 11/17 0701 - 11/18 0700 In: 601.7 [I.V.:401.7; IV Piggyback:200] Out: 100 [Urine:100]     Physical Exam: General: Alert, awake, oriented x3, in no acute distress. Heart: Regular rate and rhythm, without murmurs, rubs, gallops. Lungs: Clear to auscultation bilaterally. Abdomen: Soft, nontender, nondistended, positive bowel sounds. Extremities: No clubbing cyanosis or edema with positive pedal pulses. Neuro: Grossly intact, nonfocal.    Lab Results: Results for orders placed during the hospital encounter of 08/30/11 (from the past 24 hour(s))  POCT PREGNANCY, URINE     Status: Normal   Collection Time   08/30/11  4:46 PM      Component Value Range   Preg Test, Ur NEGATIVE    URINALYSIS, ROUTINE W REFLEX MICROSCOPIC     Status: Abnormal   Collection Time   08/30/11  4:51 PM      Component Value Range   Color, Urine YELLOW  YELLOW    Appearance CLOUDY (*) CLEAR    Specific Gravity, Urine 1.013  1.005 - 1.030    pH 7.0  5.0 - 8.0    Glucose, UA NEGATIVE  NEGATIVE (mg/dL)   Hgb urine dipstick NEGATIVE  NEGATIVE    Bilirubin Urine NEGATIVE  NEGATIVE    Ketones, ur NEGATIVE  NEGATIVE (mg/dL)   Protein, ur NEGATIVE  NEGATIVE (mg/dL)   Urobilinogen, UA 1.0  0.0 - 1.0 (mg/dL)   Nitrite NEGATIVE  NEGATIVE    Leukocytes, UA MODERATE (*) NEGATIVE   URINE MICROSCOPIC-ADD ON     Status: Abnormal   Collection Time   08/30/11  4:51 PM   Component Value Range   Squamous Epithelial / LPF FEW (*) RARE    WBC, UA 7-10  <3 (WBC/hpf)   Bacteria, UA FEW (*) RARE    Urine-Other MUCOUS PRESENT    CBC     Status: Normal   Collection Time   08/30/11  5:49 PM      Component Value Range   WBC 10.0  4.0 - 10.5 (K/uL)   RBC 4.29  3.87 - 5.11 (MIL/uL)   Hemoglobin 13.4  12.0 - 15.0 (g/dL)   HCT 91.4  78.2 - 95.6 (%)   MCV 92.1  78.0 - 100.0 (fL)   MCH 31.2  26.0 - 34.0 (pg)   MCHC 33.9  30.0 - 36.0 (g/dL)   RDW 21.3  08.6 - 57.8 (%)   Platelets 169  150 - 400 (K/uL)  DIFFERENTIAL     Status: Normal   Collection Time   08/30/11  5:49 PM      Component Value Range   Neutrophils Relative 75  43 - 77 (%)   Neutro Abs 7.4  1.7 - 7.7 (K/uL)   Lymphocytes Relative 15  12 - 46 (%)   Lymphs Abs 1.5  0.7 - 4.0 (K/uL)   Monocytes Relative 9  3 - 12 (%)   Monocytes Absolute 0.9  0.1 -  1.0 (K/uL)   Eosinophils Relative 2  0 - 5 (%)   Eosinophils Absolute 0.2  0.0 - 0.7 (K/uL)   Basophils Relative 0  0 - 1 (%)   Basophils Absolute 0.0  0.0 - 0.1 (K/uL)  BASIC METABOLIC PANEL     Status: Abnormal   Collection Time   08/30/11  5:49 PM      Component Value Range   Sodium 138  135 - 145 (mEq/L)   Potassium 3.4 (*) 3.5 - 5.1 (mEq/L)   Chloride 99  96 - 112 (mEq/L)   CO2 29  19 - 32 (mEq/L)   Glucose, Bld 85  70 - 99 (mg/dL)   BUN 10  6 - 23 (mg/dL)   Creatinine, Ser 1.61  0.50 - 1.10 (mg/dL)   Calcium 9.7  8.4 - 09.6 (mg/dL)   GFR calc non Af Amer >90  >90 (mL/min)   GFR calc Af Amer >90  >90 (mL/min)  MAGNESIUM     Status: Normal   Collection Time   08/31/11  5:30 AM      Component Value Range   Magnesium 2.0  1.5 - 2.5 (mg/dL)  PHOSPHORUS     Status: Normal   Collection Time   08/31/11  5:30 AM      Component Value Range   Phosphorus 3.1  2.3 - 4.6 (mg/dL)  COMPREHENSIVE METABOLIC PANEL     Status: Abnormal   Collection Time   08/31/11  6:30 AM      Component Value Range   Sodium 141  135 - 145 (mEq/L)   Potassium 4.0   3.5 - 5.1 (mEq/L)   Chloride 107  96 - 112 (mEq/L)   CO2 25  19 - 32 (mEq/L)   Glucose, Bld 91  70 - 99 (mg/dL)   BUN 7  6 - 23 (mg/dL)   Creatinine, Ser 0.45 (*) 0.50 - 1.10 (mg/dL)   Calcium 8.9  8.4 - 40.9 (mg/dL)   Total Protein 6.5  6.0 - 8.3 (g/dL)   Albumin 3.1 (*) 3.5 - 5.2 (g/dL)   AST 811 (*) 0 - 37 (U/L)   ALT 208 (*) 0 - 35 (U/L)   Alkaline Phosphatase 67  39 - 117 (U/L)   Total Bilirubin 0.7  0.3 - 1.2 (mg/dL)   GFR calc non Af Amer >90  >90 (mL/min)   GFR calc Af Amer >90  >90 (mL/min)  CBC     Status: Abnormal   Collection Time   08/31/11  6:30 AM      Component Value Range   WBC 6.3  4.0 - 10.5 (K/uL)   RBC 3.70 (*) 3.87 - 5.11 (MIL/uL)   Hemoglobin 11.2 (*) 12.0 - 15.0 (g/dL)   HCT 91.4 (*) 78.2 - 46.0 (%)   MCV 91.4  78.0 - 100.0 (fL)   MCH 30.3  26.0 - 34.0 (pg)   MCHC 33.1  30.0 - 36.0 (g/dL)   RDW 95.6  21.3 - 08.6 (%)   Platelets 146 (*) 150 - 400 (K/uL)  TYPE AND SCREEN     Status: Normal   Collection Time   08/31/11  7:30 AM      Component Value Range   ABO/RH(D) O POS     Antibody Screen NEG     Sample Expiration 09/03/2011      Studies/Results: Ct Abdomen Pelvis W Contrast .  IMPRESSION: Circumferential soft tissue mass involving the mid right colon suspicious for carcinoma.  This may have  perforated with stranding in the pericolonic fat and free fluid.  Original Report Authenticated By: Camelia Phenes, M.D.    Medications:    . ciprofloxacin  400 mg Intravenous Q12H  . metronidazole  500 mg Intravenous Q8H  . pantoprazole (PROTONIX) IV  40 mg Intravenous QHS  . potassium chloride  10 mEq Intravenous Q1 Hr x 2  . potassium chloride  10 mEq Intravenous Q1 Hr x 2    albuterol, iohexol, LORazepam, morphine injection, ondansetron (ZOFRAN) IV, ondansetron, senna-docusate, DISCONTD:  morphine injection     . sodium chloride 100 mL/hr at 08/31/11 0219  . DISCONTD: sodium chloride 125 mL/hr at 08/30/11 1804    Assessment/Plan:  Principal  Problem:  *Colon mass/perforation Appreciate CCS input. GI was consulted. Continue with ciprofloxacin and Flagyl for now. Active Problems:  Abdominal pain Secondary to the above. Resolved.  Hypokalemia Repleted.  UTI (lower urinary tract infection) Continue antibiotics, follow cultures.    LOS: 1 day   Evett Kassa 08/31/2011, 12:02 PM

## 2011-09-01 NOTE — Progress Notes (Signed)
D/W Dr Juanda Chance.  Not a primary colon mass.  Looks inflammatory.  Treat with antibiotics and wait for path.  Seen and agree with note.

## 2011-09-01 NOTE — Progress Notes (Signed)
Cosign for Lindsay Welch RN assessment, medication, care plan/ education, and notes. 

## 2011-09-01 NOTE — Progress Notes (Signed)
  Subjective: Pt ok. No new c/o. Drank prep and plenty thin watery stools.  Objective: Vital signs in last 24 hours: Temp:  [97.8 F (36.6 C)-98.7 F (37.1 C)] 98.1 F (36.7 C) (11/19 0500) Pulse Rate:  [58-79] 75  (11/19 0500) Resp:  [18-20] 18  (11/19 0500) BP: (111-123)/(66-81) 113/72 mmHg (11/19 0500) SpO2:  [95 %-98 %] 95 % (11/19 0500) Weight:  [119 lb 11.4 oz (54.3 kg)] 119 lb 11.4 oz (54.3 kg) (11/19 0500) Last BM Date: 08/31/11  Intake/Output this shift:    Physical Exam: ND, NT  Labs: CBC  Basename 08/31/11 0630 08/30/11 1749  WBC 6.3 10.0  HGB 11.2* 13.4  HCT 33.8* 39.5  PLT 146* 169   BMET  Basename 08/31/11 0630 08/30/11 1749  NA 141 138  K 4.0 3.4*  CL 107 99  CO2 25 29  GLUCOSE 91 85  BUN 7 10  CREATININE 0.45* 0.52  CALCIUM 8.9 9.7   LFT  Basename 08/31/11 0630  PROT 6.5  ALBUMIN 3.1*  AST 128*  ALT 208*  ALKPHOS 67  BILITOT 0.7  BILIDIR --  IBILI --  LIPASE --   PT/INR No results found for this basename: LABPROT:2,INR:2 in the last 72 hours ABG No results found for this basename: PHART:2,PCO2:2,PO2:2,HCO3:2 in the last 72 hours  Studies/Results: Ct Abdomen Pelvis W Contrast  08/30/2011  *RADIOLOGY REPORT*  Clinical Data: Right-sided abdominal pain  CT ABDOMEN AND PELVIS WITH CONTRAST  Technique:  Multidetector CT imaging of the abdomen and pelvis was performed following the standard protocol during bolus administration of intravenous contrast.  Contrast: 80mL OMNIPAQUE IOHEXOL 300 MG/ML IV SOLN  Comparison: None.  Findings: Circumferential thickening of the mid right colon.  This has an apple core appearance and is suspicious for carcinoma. There is stranding of the pericolonic fat which may be due to tumor spread or perforation.  This  does not appear to  represent diverticulitis.  This is 5 centimeters above the ileocecal valve. Negative for appendicitis.  Moderate free fluid may represent be due to colonic perforation. No  well-defined abscess.  Small low-density lesions in the left lobe liver are indeterminate but likely cysts.  No other liver lesions.  Gallbladder is normal. Pancreas and spleen are normal.  The kidneys show no obstruction or mass.  3 mm nonobstructing stone right lower pole.  IMPRESSION: Circumferential soft tissue mass involving the mid right colon suspicious for carcinoma.  This may have perforated with stranding in the pericolonic fat and free fluid.  Original Report Authenticated By: Camelia Phenes, M.D.    Assessment: Principal Problem:  *Colon perforation Active Problems:  Abdominal pain  Colonic mass  Hypokalemia  UTI (lower urinary tract infection)  Plan: Colonoscopy today, will discuss later today after findings.  LOS: 2 days    Marianna Fuss 09/01/2011

## 2011-09-01 NOTE — Procedures (Signed)
Colonoscopy prep was excellent. There was a benign-appearing 1 cm in diameter ulcer in the ascending colon which appeared benign. It was no evidence of obstruction. Multiple biopsies were obtained. Ulceration consistent with possible confined perforation

## 2011-09-01 NOTE — Progress Notes (Signed)
Subjective: Patient seen and examined just got back from colonoscopy slightly drowsy, denies any abdominal pain.  Objective: Vital signs in last 24 hours: Temp:  [98 F (36.7 C)-98.7 F (37.1 C)] 98.1 F (36.7 C) (11/19 0848) Pulse Rate:  [58-79] 74  (11/19 0848) Resp:  [13-79] 79  (11/19 1030) BP: (99-136)/(61-103) 106/61 mmHg (11/19 1030) SpO2:  [92 %-99 %] 96 % (11/19 1030) Weight:  [54.3 kg (119 lb 11.4 oz)] 119 lb 11.4 oz (54.3 kg) (11/19 0500) Weight change: -0.767 kg (-1 lb 11 oz) Last BM Date: 09/01/11  Intake/Output from previous day: 11/18 0701 - 11/19 0700 In: 2312 [P.O.:1000; I.V.:900; IV Piggyback:412] Out: 800 [Urine:800] Total I/O In: 943.3 [P.O.:240; I.V.:503.3; IV Piggyback:200] Out: 2 [Stool:2]   Physical Exam: General: Alert, awake, oriented x3, in no acute distress. HEENT: No bruits, no goiter. Heart: Regular rate and rhythm, without murmurs, rubs, gallops. Lungs: Clear to auscultation bilaterally. Abdomen: Soft, nontender, nondistended, positive bowel sounds. Extremities: No clubbing cyanosis or edema with positive pedal pulses. .    Lab Results: Results for orders placed during the hospital encounter of 08/30/11 (from the past 24 hour(s))  COMPREHENSIVE METABOLIC PANEL     Status: Abnormal   Collection Time   09/01/11  7:10 AM      Component Value Range   Sodium 141  135 - 145 (mEq/L)   Potassium 3.6  3.5 - 5.1 (mEq/L)   Chloride 107  96 - 112 (mEq/L)   CO2 23  19 - 32 (mEq/L)   Glucose, Bld 70  70 - 99 (mg/dL)   BUN 9  6 - 23 (mg/dL)   Creatinine, Ser 1.61  0.50 - 1.10 (mg/dL)   Calcium 8.8  8.4 - 09.6 (mg/dL)   Total Protein 6.4  6.0 - 8.3 (g/dL)   Albumin 3.1 (*) 3.5 - 5.2 (g/dL)   AST 56 (*) 0 - 37 (U/L)   ALT 145 (*) 0 - 35 (U/L)   Alkaline Phosphatase 62  39 - 117 (U/L)   Total Bilirubin 0.8  0.3 - 1.2 (mg/dL)   GFR calc non Af Amer >90  >90 (mL/min)   GFR calc Af Amer >90  >90 (mL/min)    Studies/Results:  Medications:     . ciprofloxacin  400 mg Intravenous Q12H  . metronidazole  500 mg Intravenous Q8H  . pantoprazole (PROTONIX) IV  40 mg Intravenous QHS  . peg 3350 powder  1 kit Oral Once    acetaminophen (TYLENOL) oral liquid 160 mg/5 mL, albuterol, LORazepam, morphine injection, ondansetron (ZOFRAN) IV, ondansetron, DISCONTD: fentaNYL, DISCONTD: midazolam, DISCONTD: ondansetron (ZOFRAN) IV, DISCONTD: senna-docusate     . sodium chloride 100 mL/hr at 09/01/11 0308    Assessment/Plan:  Principal Problem:  *Colon mass/perforation  Status post colonoscopy, showed benign looking colonic ulcer. Biopsies were sent. Continue ciprofloxacin and Flagyl .  Active Problems:  Hypokalemia  Repleted.  UTI (lower urinary tract infection)  Continue antibiotics, urine culture sterile so far. Disposition To home When it is ok by GI.   LOS: 2 days   Alyshia Kernan 09/01/2011, 4:21 PM

## 2011-09-02 DIAGNOSIS — R1031 Right lower quadrant pain: Secondary | ICD-10-CM

## 2011-09-02 DIAGNOSIS — R1903 Right lower quadrant abdominal swelling, mass and lump: Secondary | ICD-10-CM

## 2011-09-02 DIAGNOSIS — R933 Abnormal findings on diagnostic imaging of other parts of digestive tract: Secondary | ICD-10-CM

## 2011-09-02 LAB — DIFFERENTIAL
Basophils Relative: 0 % (ref 0–1)
Eosinophils Absolute: 0.1 10*3/uL (ref 0.0–0.7)
Eosinophils Relative: 1 % (ref 0–5)
Lymphs Abs: 0.8 10*3/uL (ref 0.7–4.0)
Monocytes Absolute: 0.4 10*3/uL (ref 0.1–1.0)
Monocytes Relative: 7 % (ref 3–12)
Neutrophils Relative %: 78 % — ABNORMAL HIGH (ref 43–77)

## 2011-09-02 LAB — CBC
Hemoglobin: 10.7 g/dL — ABNORMAL LOW (ref 12.0–15.0)
MCH: 30.2 pg (ref 26.0–34.0)
MCHC: 33.5 g/dL (ref 30.0–36.0)
MCV: 90.1 fL (ref 78.0–100.0)
RBC: 3.54 MIL/uL — ABNORMAL LOW (ref 3.87–5.11)

## 2011-09-02 MED ORDER — PANTOPRAZOLE SODIUM 40 MG IV SOLR
40.0000 mg | INTRAVENOUS | Status: DC
  Administered 2011-09-02: 40 mg via INTRAVENOUS
  Filled 2011-09-02 (×2): qty 40

## 2011-09-02 MED ORDER — PIPERACILLIN-TAZOBACTAM 3.375 G IVPB
3.3750 g | Freq: Three times a day (TID) | INTRAVENOUS | Status: DC
  Administered 2011-09-02 – 2011-09-03 (×4): 3.375 g via INTRAVENOUS
  Filled 2011-09-02 (×5): qty 50

## 2011-09-02 NOTE — Progress Notes (Signed)
The patient was seen, examined and PA note reviewed and data reviewed.  I agree with the plan of action. 

## 2011-09-02 NOTE — Progress Notes (Signed)
Cosign for Stephanie Shelter RN assessment, medication, education/ care plan, and notes

## 2011-09-02 NOTE — Progress Notes (Signed)
Subjective: This is seen and examined. Complaining of nausea and vomiting.  Objective: Vital signs in last 24 hours: Temp:  [97.6 F (36.4 C)-98.6 F (37 C)] 98.6 F (37 C) (11/20 1447) Pulse Rate:  [65-70] 70  (11/20 1447) Resp:  [16-18] 16  (11/20 1447) BP: (108-123)/(69-81) 108/69 mmHg (11/20 1447) SpO2:  [96 %-99 %] 99 % (11/20 1447) Weight:  [56.291 kg (124 lb 1.6 oz)] 124 lb 1.6 oz (56.291 kg) (11/20 0512) Weight change: 1.991 kg (4 lb 6.2 oz) Last BM Date: 09/01/11 (pt. stated had soft, brown, stool, but not diarrhea.)  Intake/Output from previous day: 11/19 0701 - 11/20 0700 In: 1343.3 [P.O.:240; I.V.:503.3; IV Piggyback:600] Out: 404 [Urine:402; Stool:2] Total I/O In: 480 [P.O.:480] Out: 1000 [Urine:1000]   Physical Exam: General: Alert, awake, oriented x3, in no acute distress. Heart: Regular rate and rhythm, without murmurs, rubs, gallops. Lungs: Clear to auscultation bilaterally. Abdomen: Soft, mild epigastric tenderness , nondistended, positive bowel sounds. Extremities: No clubbing cyanosis or edema with positive pedal pulses. Neuro: Grossly intact, nonfocal.    Lab Results: Results for orders placed during the hospital encounter of 08/30/11 (from the past 24 hour(s))  CBC     Status: Abnormal   Collection Time   09/02/11  1:38 AM      Component Value Range   WBC 5.8  4.0 - 10.5 (K/uL)   RBC 3.54 (*) 3.87 - 5.11 (MIL/uL)   Hemoglobin 10.7 (*) 12.0 - 15.0 (g/dL)   HCT 91.4 (*) 78.2 - 46.0 (%)   MCV 90.1  78.0 - 100.0 (fL)   MCH 30.2  26.0 - 34.0 (pg)   MCHC 33.5  30.0 - 36.0 (g/dL)   RDW 95.6  21.3 - 08.6 (%)   Platelets 155  150 - 400 (K/uL)  DIFFERENTIAL     Status: Abnormal   Collection Time   09/02/11  1:38 AM      Component Value Range   Neutrophils Relative 78 (*) 43 - 77 (%)   Neutro Abs 4.5  1.7 - 7.7 (K/uL)   Lymphocytes Relative 14  12 - 46 (%)   Lymphs Abs 0.8  0.7 - 4.0 (K/uL)   Monocytes Relative 7  3 - 12 (%)   Monocytes Absolute  0.4  0.1 - 1.0 (K/uL)   Eosinophils Relative 1  0 - 5 (%)   Eosinophils Absolute 0.1  0.0 - 0.7 (K/uL)   Basophils Relative 0  0 - 1 (%)   Basophils Absolute 0.0  0.0 - 0.1 (K/uL)  SEDIMENTATION RATE     Status: Abnormal   Collection Time   09/02/11  1:38 AM      Component Value Range   Sed Rate 35 (*) 0 - 22 (mm/hr)    Studies/Results:  Medications:    . pantoprazole (PROTONIX) IV  40 mg Intravenous Q24H  . piperacillin-tazobactam (ZOSYN)  IV  3.375 g Intravenous Q8H  . DISCONTD: ciprofloxacin  400 mg Intravenous Q12H  . DISCONTD: metronidazole  500 mg Intravenous Q8H  . DISCONTD: pantoprazole (PROTONIX) IV  40 mg Intravenous QHS    acetaminophen (TYLENOL) oral liquid 160 mg/5 mL, albuterol, LORazepam, morphine injection, ondansetron (ZOFRAN) IV, ondansetron     . sodium chloride 100 mL/hr at 09/02/11 5784    Assessment/Plan: 50 years old woman admitted for abdominal pain, CT scan showed finding concerning for colonic mass and microperforation. She underwent colonoscopy and started on Flagyl and ciprofloxacin. Principal Problem:  *Ascending colon ulcer Status post colonoscopy, biopsy  negative for malignancy. Nausea and vomiting is probably secondary to Flagyl. Switch to Zosyn by GI. Plan is to repeat CT abdomen and bone was in the a.m. Active Problems:  UTI (lower urinary tract infection)  Continue antibiotics, urine culture sterile so far.  Transaminitis Improving. CT scan shows small low-density lesion in the left lobe of the liver. Disposition  To home possibly in one to 2 days.      LOS: 3 days   Stephanie Giles 09/02/2011, 3:06 PM

## 2011-09-02 NOTE — Progress Notes (Signed)
[facility] Progress Note  Subjective: Nausea and vomiting, feels related to Flagyl.  Objective:  Vital signs in last 24 hours: Temp:  [97.6 F (36.4 C)-98.4 F (36.9 C)] 98.2 F (36.8 C) (11/20 0512) Pulse Rate:  [65-70] 70  (11/20 0512) Resp:  [16-18] 16  (11/20 0512) BP: (115-123)/(73-81) 115/73 mmHg (11/20 0512) SpO2:  [96 %-97 %] 96 % (11/20 0512) Weight:  [56.291 kg (124 lb 1.6 oz)] 124 lb 1.6 oz (56.291 kg) (11/20 0512) Last BM Date: 09/01/11 (pt. stated had soft, brown, stool, but not diarrhea.) General:   Alert,  Well-developed,  white female in NAD Heart:  Regular rate and rhythm;  Abdomen:  Soft, nontender and nondistended. Normal bowel sounds, without guarding, and without rebound.   Neurologic:  Alert and  oriented x4;  grossly normal neurologically. Psych:  Alert and cooperative. Normal mood and affect.  Intake/Output from previous day: 11/19 0701 - 11/20 0700 In: 1343.3 [P.O.:240; I.V.:503.3; IV Piggyback:600] Out: 404 [Urine:402; Stool:2] Intake/Output this shift: Total I/O In: 240 [P.O.:240] Out: -   Lab Results:  Basename 09/02/11 0138 08/31/11 0630 08/30/11 1749  WBC 5.8 6.3 10.0  HGB 10.7* 11.2* 13.4  HCT 31.9* 33.8* 39.5  PLT 155 146* 169   BMET  Basename 09/01/11 0710 08/31/11 0630 08/30/11 1749  NA 141 141 138  K 3.6 4.0 3.4*  CL 107 107 99  CO2 23 25 29   GLUCOSE 70 91 85  BUN 9 7 10   CREATININE 0.54 0.45* 0.52  CALCIUM 8.8 8.9 9.7   LFT  Basename 09/01/11 0710  PROT 6.4  ALBUMIN 3.1*  AST 56*  ALT 145*  ALKPHOS 62  BILITOT 0.8  BILIDIR --  IBILI --    Assessment / Plan: 1. Ascending colon ulcer / contained perforation - Biopsies negative for malignancy, +inflammatory. Etiology?. May be secondary to NSAIDS. No diverticular disease present in area of perforation. Flagyl causing N+V, will change to Zosyn which will give her anaerobic coverage. Needs repeat CT scan tomorrow. Hopefully home in next couple of days on 2  weeks of Augmentin. Will need suspension (cannot take pills). Repeat outpatient CTscan for follow up in a few weeks.  2. Transaminitis, improving. ALT was 208 on admit, down to 145 yesterday. Etiology? CT scans shows only small low-density lesions in the left lobe liver which are indeterminate but likely cysts. No other liver lesions. Will recheck tomorrow to make sure continuing to trend down and then follow up outpatient.   I have personally reviewed the above note and agree with the plan of care. [name], MD, much improved in her lower abdomen but is now vomiting likely from IV Flagyl.  Path report discussed with Dr Frederica Kuster - benign  Nonspecific inflammation c/w ulcer, no granuloma.  We will repeat CT scan tomorrow, disc. Flagyl, switch from Cipro to Zosyn.     LOS: 3 days   Willette Cluster  09/02/2011, 10:54 AM

## 2011-09-02 NOTE — Progress Notes (Signed)
1 Day Post-Op  Subjective: Pt seen. C/o belching, nausea, and felling lousy overall. Sipping some clears as tol.  Objective: Vital signs in last 24 hours: Temp:  [97.6 F (36.4 C)-98.4 F (36.9 C)] 98.2 F (36.8 C) (11/20 0512) Pulse Rate:  [65-70] 70  (11/20 0512) Resp:  [14-79] 16  (11/20 0512) BP: (99-123)/(61-81) 115/73 mmHg (11/20 0512) SpO2:  [96 %-99 %] 96 % (11/20 0512) Weight:  [124 lb 1.6 oz (56.291 kg)] 124 lb 1.6 oz (56.291 kg) (11/20 0512) Last BM Date: 09/01/11 (pt. stated had soft, brown, stool, but not diarrhea.)  Intake/Output this shift: Total I/O In: 240 [P.O.:240] Out: -   Physical Exam: Abdomen soft, ND, NT. Few BS  Labs: CBC  Basename 09/02/11 0138 08/31/11 0630  WBC 5.8 6.3  HGB 10.7* 11.2*  HCT 31.9* 33.8*  PLT 155 146*   BMET  Basename 09/01/11 0710 08/31/11 0630  NA 141 141  K 3.6 4.0  CL 107 107  CO2 23 25  GLUCOSE 70 91  BUN 9 7  CREATININE 0.54 0.45*  CALCIUM 8.8 8.9   LFT  Basename 09/01/11 0710  PROT 6.4  ALBUMIN 3.1*  AST 56*  ALT 145*  ALKPHOS 62  BILITOT 0.8  BILIDIR --  IBILI --  LIPASE --   PT/INR No results found for this basename: LABPROT:2,INR:2 in the last 72 hours ABG No results found for this basename: PHART:2,PCO2:2,PO2:2,HCO3:2 in the last 72 hours  Studies/Results: No results found.  Assessment: Principal Problem:  *Colon perforation Active Problems:  Abdominal pain  Colonic mass  Hypokalemia  UTI (lower urinary tract infection)  s/p Procedure(s): COLONOSCOPY- Bx path pending. Plan: Await path. N/V, belching---Secondary to meds?  LOS: 3 days    Stephanie Giles 09/02/2011

## 2011-09-03 ENCOUNTER — Inpatient Hospital Stay (HOSPITAL_COMMUNITY): Payer: BC Managed Care – PPO

## 2011-09-03 LAB — COMPREHENSIVE METABOLIC PANEL
AST: 26 U/L (ref 0–37)
Albumin: 3.2 g/dL — ABNORMAL LOW (ref 3.5–5.2)
BUN: 4 mg/dL — ABNORMAL LOW (ref 6–23)
Calcium: 8.7 mg/dL (ref 8.4–10.5)
Chloride: 103 mEq/L (ref 96–112)
Creatinine, Ser: 0.52 mg/dL (ref 0.50–1.10)
Total Bilirubin: 0.4 mg/dL (ref 0.3–1.2)
Total Protein: 6.1 g/dL (ref 6.0–8.3)

## 2011-09-03 LAB — CBC
HCT: 33.1 % — ABNORMAL LOW (ref 36.0–46.0)
MCH: 30.3 pg (ref 26.0–34.0)
MCHC: 34.1 g/dL (ref 30.0–36.0)
MCV: 88.7 fL (ref 78.0–100.0)
Platelets: 162 10*3/uL (ref 150–400)
RDW: 12.1 % (ref 11.5–15.5)
WBC: 6.4 10*3/uL (ref 4.0–10.5)

## 2011-09-03 MED ORDER — AMOXICILLIN-POT CLAVULANATE 400-57 MG/5ML PO SUSR: ORAL | Status: DC

## 2011-09-03 MED ORDER — ONDANSETRON HCL 4 MG PO TABS: 4.0000 mg | ORAL_TABLET | Freq: Four times a day (QID) | ORAL | Status: AC | PRN

## 2011-09-03 MED ORDER — PANTOPRAZOLE SODIUM 40 MG PO TBEC: 40.0000 mg | DELAYED_RELEASE_TABLET | Freq: Every day | ORAL | Status: DC

## 2011-09-03 MED ORDER — IOHEXOL 300 MG/ML  SOLN
80.0000 mL | Freq: Once | INTRAMUSCULAR | Status: AC | PRN
  Administered 2011-09-03: 80 mL via INTRAVENOUS

## 2011-09-03 NOTE — Progress Notes (Signed)
Subjective Mild nausea, belching, soft stools  Objective: Vital signs in last 24 hours: Temp:  [98 F (36.7 C)-98.6 F (37 C)] 98.4 F (36.9 C) (11/21 0529) Pulse Rate:  [68-77] 77  (11/21 0529) Resp:  [16-18] 17  (11/21 0529) BP: (108-130)/(69-85) 130/85 mmHg (11/21 0529) SpO2:  [97 %-99 %] 98 % (11/21 0529) Weight:  [54.7 kg (120 lb 9.5 oz)] 120 lb 9.5 oz (54.7 kg) (11/21 0529) Last BM Date: 09/02/11 General:   Alert,  Well-developed, well-nourished, pleasant and cooperative in NAD Head:  Normocephalic and atraumatic. Eyes:  Sclera clear, no icterus.   Conjunctiva pink. Mouth:  No deformity or lesions, dentition normal. Neck:  Supple; no masses or thyromegaly. Heart:  Regular rate and rhythm; no murmurs, clicks, rubs,  or gallops. Abdomen:  Soft, mild tenderness RLQ and epig  normal B.S.'  Msk:  Symmetrical without gross deformities. Normal posture. Pulses:  Normal pulses noted. Extremities:  Without clubbing or edema. Neurologic:  Alert and  oriented x4;  grossly normal neurologically. Skin:  Intact without significant lesions or rashes. Cervical Nodes:  No significant cervical adenopathy. Psych:  Alert and cooperative. Normal mood and affect.  Intake/Output from previous day: 11/20 0701 - 11/21 0700 In: 4720 [P.O.:940; I.V.:3630; IV Piggyback:150] Out: 2000 [Urine:2000] Intake/Output this shift: Total I/O In: 0  Out: 500 [Urine:500]  Lab Results:  [facility] 09/03/11 0630 09/02/11 0138  WBC 6.4 5.8  HGB 11.3* 10.7*  HCT 33.1* 31.9*  PLT 162 155   BMET  Basename 09/03/11 0630 09/01/11 0710  NA 139 141  K 3.3* 3.6  CL 103 107  CO2 26 23  GLUCOSE 89 70  BUN 4* 9  CREATININE 0.52 0.54  CALCIUM 8.7 8.8   LFT  Basename 09/03/11 0630  PROT 6.1  ALBUMIN 3.2*  AST 26  ALT 84*  ALKPHOS 54  BILITOT 0.4  BILIDIR --  IBILI --   PT/INR No results found for this basename: LABPROT:2,INR:2 in the last 72 hours Hepatitis Panel No results found for this  basename: HEPBSAG,HCVAB,HEPAIGM,HEPBIGM in the last 72 hours   Studies/Results: Ct Abdomen Pelvis W Contrast  09/03/2011  *RADIOLOGY REPORT*  Clinical Data: Abdominal pain  CT ABDOMEN AND PELVIS WITH CONTRAST  Technique:  Multidetector CT imaging of the abdomen and pelvis was performed following the standard protocol during bolus administration of intravenous contrast.  Contrast: 80mL OMNIPAQUE IOHEXOL 300 MG/ML IV SOLN  Comparison: 08/30/2011  Findings: No pericardial or pleural effusions identified. Subpleural atelectasis is noted within both lung bases.  Focal area of low density within the lateral segment of the left hepatic lobe measures 0.5 cm, image 9.  Within the posterior right hepatic lobe there is a low density structure measuring 0.4 cm, image 9.  The gallbladder wall appears slightly thickened no biliary ductal dilatation.  The pancreas is negative.  The spleen appears normal.  The adrenal glands are both normal. There is a nonobstructing punctate calculus within the inferior pole collecting system of the left kidney measuring 3 mm.  There are several small (punctate) calculi within the lower pole of the right kidney.  No evidence for obstructive uropathy.  The urinary bladder appears normal.  The stomach and the small bowel loops appear normal.  Wall thickening and inflammatory changes involving the ascending colon distal to the level of the ileocecal valve is again noted. Within this area there is a focal collection of contrast material which appears to extend beyond the lumen of the colon.  This collection  measures approximately 0.6 cm.  Compared with the previous examination the degree of inflammation has improved. There has been slight decrease in fat stranding and right lower quadrant free fluid.   No new findings identified.  The remaining portions of the colon appear normal.  Review of the visualized osseous structures are unremarkable.  IMPRESSION:  1.  Stable to interval improvement of  inflammatory changes, wall thickening and fat stranding involving the ascending colon.  A focal area of contained extra luminal contrast material is identified along the lateral aspect of the ascending colon which may represent a small perforation.  Results were discussed at the time of interpretation on 09/03/2011 at 10:49 a.m. to  Dr. Juanda Chance, who verbally acknowledged these results.  Dr.Tashema Tiller reported that the patient had colonoscopy since the exam from 08/30/2011 and this was a negative for malignancy. We discussed the possibility that these findings may be inflammatory in etiology and possibly representing a small perforation. Obviously, underlying malignancy would be difficult to exclude based upon the CT scan findings alone.  The patient will have a follow-up CT scan and 3 weeks to assess for interval change.  Original Report Authenticated By: Rosealee Albee, M.D.    Assessment: Principal Problem:  *Colon perforation Active Problems:  Abdominal pain  Colonic mass  Hypokalemia  UTI (lower urinary tract infection)   Doing well, abd. Pain resolved. Residual symptoms c/w IBS. CT scan today shows partial resolution of the inflammatory mass ascending colon, small amount of retained contract at the perforated area. Overall ready for discharge  Plan:Discharge today- [facility]. Augmentin 875mg  po bid, liquid Pepcid 40 mg poqdlow residue diet OV with me Dec 8,Mon 8.45 am Probiotic ( activia yogurt)   LOS: 4 days   [name]  09/03/2011, 11:14 AM

## 2011-09-03 NOTE — Progress Notes (Signed)
2 Days Post-Op  Subjective: Pt feeling better overall, after abx switch. Some mild nausea---'feel hungry" Minimal pain. Drinking contrast for repeat CT  Objective: Vital signs in last 24 hours: Temp:  [98 F (36.7 C)-98.6 F (37 C)] 98.4 F (36.9 C) (11/21 0529) Pulse Rate:  [68-77] 77  (11/21 0529) Resp:  [16-18] 17  (11/21 0529) BP: (108-130)/(69-85) 130/85 mmHg (11/21 0529) SpO2:  [97 %-99 %] 98 % (11/21 0529) Weight:  [120 lb 9.5 oz (54.7 kg)] 120 lb 9.5 oz (54.7 kg) (11/21 0529) Last BM Date: 09/02/11  Intake/Output this shift:    Physical Exam: Abdomen: soft, ND, NT, no mass  Labs: CBC  Basename 09/03/11 0630 09/02/11 0138  WBC 6.4 5.8  HGB 11.3* 10.7*  HCT 33.1* 31.9*  PLT 162 155   BMET  Basename 09/03/11 0630 09/01/11 0710  NA 139 141  K 3.3* 3.6  CL 103 107  CO2 26 23  GLUCOSE 89 70  BUN 4* 9  CREATININE 0.52 0.54  CALCIUM 8.7 8.8   LFT  Basename 09/03/11 0630  PROT 6.1  ALBUMIN 3.2*  AST 26  ALT 84*  ALKPHOS 54  BILITOT 0.4  BILIDIR --  IBILI --  LIPASE --   PT/INR No results found for this basename: LABPROT:2,INR:2 in the last 72 hours ABG No results found for this basename: PHART:2,PCO2:2,PO2:2,HCO3:2 in the last 72 hours  Studies/Results: No results found.  Assessment: Principal Problem:  *Colon perforation, path negative per GI note, I have not seen final path. Active Problems:  Abdominal pain  Colonic mass  Hypokalemia  UTI (lower urinary tract infection) s/p Procedure(s): COLONOSCOPY Plan: Repeat CT today, if improvement---no abscess, perf, and inflammation improved/resolved, will sign off.  LOS: 4 days    Marianna Fuss 09/03/2011

## 2011-09-03 NOTE — Discharge Summary (Signed)
PATIENT DETAILS Name: Stephanie Giles Age: 50 y.o. Sex: female Date of Birth: May 18, 1961 MRN: [id number]. Admit Date: 08/30/2011 Admitting Physician: Baltazar Najjar PCP:No primary provider on file.  PRIMARY DISCHARGE DIAGNOSIS:  Principal Problem:  *Colon microperforation-1 cm ulcer in the ascending colon Active Problems:  Abdominal pain  Colonic mass  Hypokalemia       PAST MEDICAL HISTORY: Past Medical History  Diagnosis Date  . Irritable bowel   . Lactose intolerance     DISCHARGE MEDICATIONS: Current Discharge Medication List    START taking these medications   Details  amoxicillin-clavulanate (AUGMENTIN) 400-57 MG/5ML suspension Take 10 ml twice daily Qty: 100 mL, Refills: 0    ondansetron (ZOFRAN) 4 MG tablet Take 1 tablet (4 mg total) by mouth every 6 (six) hours as needed for nausea. Qty: 20 tablet, Refills: 0    pantoprazole (PROTONIX) 40 MG tablet Take 1 tablet (40 mg total) by mouth daily. Qty: 30 tablet, Refills: 0      CONTINUE these medications which have NOT CHANGED   Details  ALPRAZolam (XANAX) 0.5 MG tablet Take 0.5 mg by mouth daily as needed. For anxiety     loratadine (CLARITIN) 10 MG tablet Take 10 mg by mouth daily.      vitamin C (ASCORBIC ACID) 500 MG tablet Take 500 mg by mouth daily.      Multiple Vitamins-Minerals (MULTIVITAMINS THER. W/MINERALS) TABS Take 1 tablet by mouth daily.        STOP taking these medications     Multiple Vitamin (MULTIVITAMIN) capsule          BRIEF HPI:  See H&P, Labs, Consult and Test reports for all details in brief, patient was admitted for abdominal pain . For further details please see the history and physical done on admission.  CONSULTATIONS:   GI and general surgery  PERTINENT RADIOLOGIC STUDIES: Ct Abdomen Pelvis W Contrast  09/03/2011  *RADIOLOGY REPORT*  Clinical Data: Abdominal pain  CT ABDOMEN AND PELVIS WITH CONTRAST  Technique:  Multidetector CT imaging of the abdomen and pelvis  was performed following the standard protocol during bolus administration of intravenous contrast.  Contrast: 80mL OMNIPAQUE IOHEXOL 300 MG/ML IV SOLN  Comparison: 08/30/2011  Findings: No pericardial or pleural effusions identified. Subpleural atelectasis is noted within both lung bases.  Focal area of low density within the lateral segment of the left hepatic lobe measures 0.5 cm, image 9.  Within the posterior right hepatic lobe there is a low density structure measuring 0.4 cm, image 9.  The gallbladder wall appears slightly thickened no biliary ductal dilatation.  The pancreas is negative.  The spleen appears normal.  The adrenal glands are both normal. There is a nonobstructing punctate calculus within the inferior pole collecting system of the left kidney measuring 3 mm.  There are several small (punctate) calculi within the lower pole of the right kidney.  No evidence for obstructive uropathy.  The urinary bladder appears normal.  The stomach and the small bowel loops appear normal.  Wall thickening and inflammatory changes involving the ascending colon distal to the level of the ileocecal valve is again noted. Within this area there is a focal collection of contrast material which appears to extend beyond the lumen of the colon.  This collection measures approximately 0.6 cm.  Compared with the previous examination the degree of inflammation has improved. There has been slight decrease in fat stranding and right lower quadrant free fluid.   No new findings identified.  The  remaining portions of the colon appear normal.  Review of the visualized osseous structures are unremarkable.  IMPRESSION:  1.  Stable to interval improvement of inflammatory changes, wall thickening and fat stranding involving the ascending colon.  A focal area of contained extra luminal contrast material is identified along the lateral aspect of the ascending colon which may represent a small perforation.  Results were discussed at the  time of interpretation on 09/03/2011 at 10:49 a.m. to  Dr. Juanda Chance, who verbally acknowledged these results.  Dr.Brodie reported that the patient had colonoscopy since the exam from 08/30/2011 and this was a negative for malignancy. We discussed the possibility that these findings may be inflammatory in etiology and possibly representing a small perforation. Obviously, underlying malignancy would be difficult to exclude based upon the CT scan findings alone.  The patient will have a follow-up CT scan and 3 weeks to assess for interval change.  Original Report Authenticated By: Rosealee Albee, M.D.   Ct Abdomen Pelvis W Contrast  08/30/2011  *RADIOLOGY REPORT*  Clinical Data: Right-sided abdominal pain  CT ABDOMEN AND PELVIS WITH CONTRAST  Technique:  Multidetector CT imaging of the abdomen and pelvis was performed following the standard protocol during bolus administration of intravenous contrast.  Contrast: 80mL OMNIPAQUE IOHEXOL 300 MG/ML IV SOLN  Comparison: None.  Findings: Circumferential thickening of the mid right colon.  This has an apple core appearance and is suspicious for carcinoma. There is stranding of the pericolonic fat which may be due to tumor spread or perforation.  This  does not appear to  represent diverticulitis.  This is 5 centimeters above the ileocecal valve. Negative for appendicitis.  Moderate free fluid may represent be due to colonic perforation. No well-defined abscess.  Small low-density lesions in the left lobe liver are indeterminate but likely cysts.  No other liver lesions.  Gallbladder is normal. Pancreas and spleen are normal.  The kidneys show no obstruction or mass.  3 mm nonobstructing stone right lower pole.  IMPRESSION: Circumferential soft tissue mass involving the mid right colon suspicious for carcinoma.  This may have perforated with stranding in the pericolonic fat and free fluid.  Original Report Authenticated By: Camelia Phenes, M.D.     PERTINENT LAB  RESULTS: CBC:  Basename 09/03/11 0630 09/02/11 0138  WBC 6.4 5.8  HGB 11.3* 10.7*  HCT 33.1* 31.9*  PLT 162 155   CMET CMP     Component Value Date/Time   NA 139 09/03/2011 0630   K 3.3* 09/03/2011 0630   CL 103 09/03/2011 0630   CO2 26 09/03/2011 0630   GLUCOSE 89 09/03/2011 0630   BUN 4* 09/03/2011 0630   CREATININE 0.52 09/03/2011 0630   CALCIUM 8.7 09/03/2011 0630   PROT 6.1 09/03/2011 0630   ALBUMIN 3.2* 09/03/2011 0630   AST 26 09/03/2011 0630   ALT 84* 09/03/2011 0630   ALKPHOS 54 09/03/2011 0630   BILITOT 0.4 09/03/2011 0630   GFRNONAA >90 09/03/2011 0630   GFRAA >90 09/03/2011 0630    GFR Estimated Creatinine Clearance: 71.8 ml/min (by C-G formula based on Cr of 0.52). No results found for this basename: LIPASE:2,AMYLASE:2 in the last 72 hours No results found for this basename: CKTOTAL:3,CKMB:3,CKMBINDEX:3,TROPONINI:3 in the last 72 hours No results found for this basename: POCBNP:3 in the last 72 hours No results found for this basename: DDIMER:2 in the last 72 hours No results found for this basename: HGBA1C:2 in the last 72 hours No results found for this  basename: CHOL:2,HDL:2,LDLCALC:2,TRIG:2,CHOLHDL:2,LDLDIRECT:2 in the last 72 hours No results found for this basename: TSH,T4TOTAL,FREET3,T3FREE,THYROIDAB in the last 72 hours No results found for this basename: VITAMINB12:2,FOLATE:2,FERRITIN:2,TIBC:2,IRON:2,RETICCTPCT:2 in the last 72 hours Coags: No results found for this basename: PT:2,INR:2 in the last 72 hours Microbiology: Recent Results (from the past 240 hour(s))  URINE CULTURE     Status: Normal   Collection Time   08/30/11  5:52 PM      Component Value Range Status Comment   Specimen Description URINE, CLEAN CATCH   Final    Special Requests NONE   Final    Setup Time [id number]   Final    Colony Count 35,000 COLONIES/ML   Final    Culture     Final    Value: GROUP B STREP(S.AGALACTIAE)ISOLATED     Note: TESTING AGAINST S. AGALACTIAE  NOT ROUTINELY PERFORMED DUE TO PREDICTABILITY OF AMP/PEN/VAN SUSCEPTIBILITY.   Report Status 09/01/2011 FINAL   Final   URINE CULTURE     Status: Normal   Collection Time   08/31/11  2:04 PM      Component Value Range Status Comment   Specimen Description URINE, CLEAN CATCH   Final    Special Requests NONE   Final    Setup Time [id number]   Final    Colony Count NO GROWTH   Final    Culture NO GROWTH   Final    Report Status 09/01/2011 FINAL   Final      BRIEF HOSPITAL COURSE:   Principal Problem:  *Colon microperforation- -And presented with abdominal pain along with nausea and vomiting. A CT scan of the abdomen was suspicious for microperforation along with a questionable colonic mass. -GI and general surgery were consulted. -A colonoscopy was done, which showed a benign-looking ulcer in the ascending colon. -Patient was initially kept n.p.o. and given supportive care, she is now tolerating a full liquid diet. -She has been seen by GI today and has been cleared for for discharge, she is to take Augmentin and stay on a full liquid to a low residual diet. -She will follow with Dr. Juanda Chance as indicated below  Active Problems:  Abdominal pain -This is resolved, this is secondary to above.   Colonic mass -This was seen on the CT scan of the abdomen, however a colonoscopy showed a benign-looking ascending colonic ulcer.   Hypokalemia This is secondary to GI loss and now been resolved.  TODAY-DAY OF DISCHARGE:  Subjective:   Liam Rinke today has no headache,no chest abdominal pain,no new weakness tingling or numbness, feels much better wants to go home today.   Objective:   Blood pressure 116/80, pulse 85, temperature 98.4 F (36.9 C), temperature source Oral, resp. rate 18, height 5\' 4"  (1.626 m), weight 54.7 kg (120 lb 9.5 oz), last menstrual period 08/17/2011, SpO2 96.00%.  Intake/Output Summary (Last 24 hours) at 09/03/11 1426 Last data filed at 09/03/11 1300   Gross per 24 hour  Intake   4490 ml  Output   1700 ml  Net   2790 ml    Exam Awake Alert, Oriented *3, No new F.N deficits, Normal affect [place].AT,PERRAL Supple Neck,No JVD, No cervical lymphadenopathy appriciated.  Symmetrical Chest wall movement, Good air movement bilaterally, CTAB RRR,No Gallops,Rubs or new Murmurs, No Parasternal Heave +ve B.Sounds, Abd Soft, Non tender, No organomegaly appriciated, No rebound -guarding or rigidity. No Cyanosis, Clubbing or edema, No new Rash or bruise  DISPOSITION: home  DISCHARGE INSTRUCTIONS:    Follow-up Information  Follow up with [name], MD on 09/20/2011. (at  8:45 am)    Contact information:   [address] [facility] [address] [place] [address] [place] [address] [phone]       Follow up with Caprice Beaver. Make an appointment in 2 weeks.   Contact information:   [address] [place] [address] [phone]          Total Time spent on discharge equals 45 minutes.  SignedJeoffrey Massed 09/03/2011 2:26 PM

## 2011-09-03 NOTE — Progress Notes (Signed)
The patient was seen, examined and PA note reviewed and data reviewed.  I agree with the plan of action. 

## 2011-09-05 ENCOUNTER — Encounter: Payer: Self-pay | Admitting: Internal Medicine

## 2011-09-08 ENCOUNTER — Encounter: Payer: Self-pay | Admitting: Internal Medicine

## 2011-09-09 ENCOUNTER — Encounter (HOSPITAL_COMMUNITY): Payer: Self-pay | Admitting: Internal Medicine

## 2011-09-10 ENCOUNTER — Telehealth: Payer: Self-pay | Admitting: Internal Medicine

## 2011-09-10 NOTE — Telephone Encounter (Signed)
It may be  Anticipated for her to have some discomfort on the right side of the abdomen. OK to  Use Levsin SL.125 mg q 4-6 hrs, #30, should still be on soft diet .

## 2011-09-10 NOTE — Telephone Encounter (Signed)
Patient is having some crampy pain, very gassy and bloating in the RLQ she is having normal bowel movements.  She has a palpable Lump in her side and states she can feel "things moving through my colon" on the right.  She is tolerating a diet, denies rectal bleeding, constipation/diarrhea or fever.  Pain is not constant, but she is very aware of the "things moving through her colon".  Dr Juanda Chance please advise.  Does she need antispasmodic?

## 2011-09-11 MED ORDER — HYOSCYAMINE SULFATE 0.125 MG SL SUBL: 0.1250 mg | SUBLINGUAL_TABLET | SUBLINGUAL | Status: DC | PRN

## 2011-09-11 NOTE — Telephone Encounter (Signed)
I have left a detailed message for the patient with Dr Regino Schultze response and recommendations.  I have asked that she call back with any questions

## 2011-09-12 ENCOUNTER — Encounter: Payer: Self-pay | Admitting: *Deleted

## 2011-09-12 ENCOUNTER — Telehealth: Payer: Self-pay | Admitting: Internal Medicine

## 2011-09-12 NOTE — Telephone Encounter (Signed)
All questions answered

## 2011-09-22 ENCOUNTER — Ambulatory Visit (INDEPENDENT_AMBULATORY_CARE_PROVIDER_SITE_OTHER): Payer: BC Managed Care – PPO | Admitting: Internal Medicine

## 2011-09-22 ENCOUNTER — Other Ambulatory Visit: Payer: Self-pay | Admitting: Obstetrics & Gynecology

## 2011-09-22 ENCOUNTER — Encounter: Payer: Self-pay | Admitting: Internal Medicine

## 2011-09-22 ENCOUNTER — Other Ambulatory Visit (INDEPENDENT_AMBULATORY_CARE_PROVIDER_SITE_OTHER): Payer: BC Managed Care – PPO

## 2011-09-22 DIAGNOSIS — R933 Abnormal findings on diagnostic imaging of other parts of digestive tract: Secondary | ICD-10-CM

## 2011-09-22 DIAGNOSIS — K5732 Diverticulitis of large intestine without perforation or abscess without bleeding: Secondary | ICD-10-CM

## 2011-09-22 LAB — COMPREHENSIVE METABOLIC PANEL
Albumin: 4.1 g/dL (ref 3.5–5.2)
BUN: 11 mg/dL (ref 6–23)
CO2: 30 mEq/L (ref 19–32)
GFR: 111.72 mL/min (ref >60.00)
Glucose, Bld: 94 mg/dL (ref 70–99)
Potassium: 3.8 mEq/L (ref 3.5–5.1)
Sodium: 141 mEq/L (ref 135–145)
Total Protein: 7.1 g/dL (ref 6.0–8.3)

## 2011-09-22 LAB — CBC WITH DIFFERENTIAL/PLATELET
Basophils Relative: 0.5 % (ref 0.0–3.0)
Eosinophils Relative: 3.1 % (ref 0.0–5.0)
Hemoglobin: 13.1 g/dL (ref 12.0–15.0)
Lymphocytes Relative: 22.8 % (ref 12.0–46.0)
MCHC: 34.8 g/dL (ref 30.0–36.0)
MCV: 90.3 fl (ref 78.0–100.0)
Monocytes Absolute: 0.4 10*3/uL (ref 0.1–1.0)
Neutro Abs: 3.5 10*3/uL (ref 1.4–7.7)
Neutrophils Relative %: 65.7 % (ref 43.0–77.0)
RBC: 4.18 Mil/uL (ref 3.87–5.11)
WBC: 5.4 10*3/uL (ref 4.5–10.5)

## 2011-09-22 NOTE — Progress Notes (Signed)
Stephanie Giles 1961-07-24 MRN [id number]     History of Present Illness:  This is a 50 year old white female who is post hospitalization for perforated ascending colon due to a benign ulcer, likely from NSAID's.. She presented with right lower quadrant abdominal pain and an abnormal CT with an inflammatory circumferential mass in the right colon suggestive of malignancy. A colonoscopy showed a nonspecific ulcerated area representing either a perforated diverticulum or NSAID-induced ulcer. Biopsies were nonspecific. She responded to intravenous antibiotics and subsequently oral Augmentin which was completed  last week. The pain has resolved but has recurred in the right lower quadrant in the last 48 hours. She has remained on a low-residue diet. She is due for a followup CT scan of the abdomen. She has avoided anti-inflammatory medications for her headaches. She has lost 4 pounds from her usual 122 pounds to a current118 pounds. She has been taking Levsin sublingual when necessary in addition to probiotics. She is complaining of post prandial belching. There is a history of irritable bowel syndrome.   Past Medical History  Diagnosis Date  . Irritable bowel   . Lactose intolerance   . Colon ulcer   . Perforation of colon    Past Surgical History  Procedure Date  . Cesarean section 1987  . Tonsillectomy   . Colonoscopy 09/01/2011    Procedure: COLONOSCOPY;  Surgeon: Hart Carwin, MD;  Location: [facility] ENDOSCOPY;  Service: Endoscopy;  Laterality: N/A;    reports that she has never smoked. She has never used smokeless tobacco. She reports that she drinks about 2.4 ounces of alcohol per week. She reports that she does not use illicit drugs. family history includes Breast cancer in her sister; Coronary artery disease in her mother; Ovarian cancer in her mother; and Stroke in her father. Allergies  Allergen Reactions  . Biaxin         Review of Systems: Denies heartburn dysphagia chest pain.  Positive for weight loss   The remainder of the 10 point ROS is negative except as outlined in H&P   Physical Exam: General appearance  Well developed, in no distress. Eyes- non icteric. HEENT nontraumatic, normocephalic. Mouth no lesions, tongue papillated, no cheilosis. Neck supple without adenopathy, thyroid not enlarged, no carotid bruits, no JVD. Lungs Clear to auscultation bilaterally. Cor normal S1, normal S2, regular rhythm, no murmur,  quiet precordium. Abdomen: Soft nontender abdomen but minimal discomfort in the right lower quadrant. Positive bowel sounds. No distention. No tympany. No palpable mass. Liver edge at costal margin. Left lower and upper quadrants are unremarkable. Rectal: Not done. Extremities no pedal edema. Skin no lesions. Neurological alert and oriented x 3. Psychological normal mood and affect.  Assessment and Plan:  Problem #1 status post right colon perforation responding to conservative measures. It was likely caused by NSAIDs which she took for headaches. On colonoscopy,there were no diverticula noted. She will increase her fiber content and we will set her up for a CT scan of the abdomen and pelvis to follow on the resolution of the right colon inflammation. We will check her CBC and chemistries today. She may continue on Levsin sublingual when necessary, and avoid anti-inflammatory agents. She will also continue on probiotics. She may use Gaviscon liquid when necessary for indigestion. I will see her in 3 months.  Problem #2 history of irritable bowel syndrome. Patient is currently doing well using Levsin sublingual when necessary.   09/22/2011 [name]

## 2011-09-22 NOTE — Patient Instructions (Addendum)
Your physician has requested that you go to the basement for the following lab work before leaving today: CBC, Sedimentation Rate, CMET Please follow up with Dr Juanda Chance in 3 months. You have been scheduled for a CT scan of the abdomen and pelvis at  CT ([address]---this is in the same building as [facility]).   You are scheduled on 10/01/11 at 10:00 am. You should arrive 15 minutes prior to your appointment time for registration. Please follow the written instructions below on the day of your exam:  WARNING: IF YOU ARE ALLERGIC TO IODINE/X-RAY DYE, PLEASE NOTIFY RADIOLOGY IMMEDIATELY AT [phone]! YOU WILL BE GIVEN A 13 HOUR PREMEDICATION PREP.  1) Do not eat or drink anything after 6:00 am (4 hours prior to your test) 2) You have been given 2 bottles of oral contrast to drink. The solution may taste better if refrigerated, but do NOT add ice or any other liquid to this solution. Shake well before drinking.    Drink 1 bottle of contrast @ 8:00 am (2 hours prior to your exam)  Drink 1 bottle of contrast @ 9:00 am (1 hour prior to your exam)  You may take any medications as prescribed with a small amount of water except for the following: Metformin, Glucophage, Glucovance, Avandamet, Riomet, Fortamet, Actoplus Met, Janumet, Glumetza or Metaglip. The above medications must be held the day of the exam AND 48 hours after the exam.  The purpose of you drinking the oral contrast is to aid in the visualization of your intestinal tract. The contrast solution may cause some diarrhea. Before your exam is started, you will be given a small amount of fluid to drink. Depending on your individual set of symptoms, you may also receive an intravenous injection of x-ray contrast/dye. Plan on being at [facility] for 30 minutes or long, depending on the type of exam you are having performed.  If you have any questions regarding your exam or if you need to reschedule, you  may call the CT department at [phone] between the hours of 8:00 am and 5:00 pm, Monday-Friday.  ________________________________________________________________________ CC: Dr Aldona Bar

## 2011-09-24 ENCOUNTER — Telehealth: Payer: Self-pay | Admitting: *Deleted

## 2011-09-24 NOTE — Telephone Encounter (Signed)
Message copied by Daphine Deutscher on Wed Sep 24, 2011  8:18 AM ------      Message from: Hart Carwin      Created: Tue Sep 23, 2011  4:54 PM       Please call pt, all blood tests are back to normal

## 2011-09-24 NOTE — Telephone Encounter (Signed)
Left a message for patient to call me. 

## 2011-09-24 NOTE — Telephone Encounter (Signed)
Patient given lab results as per Dr. Brodie. 

## 2011-10-01 ENCOUNTER — Telehealth: Payer: Self-pay | Admitting: Internal Medicine

## 2011-10-01 ENCOUNTER — Other Ambulatory Visit: Payer: BC Managed Care – PPO

## 2011-10-01 NOTE — Telephone Encounter (Signed)
Patient calling to report for the last few days, she has been feeling right lower quad abdominal pain again. Not a sharp pain but a dull ache that is increasing in intensity. Also, has noticed neck pain "like a sinus infection" pain. Last night, she had chills but no fever. She has noticed some bowel movement are skinny,floating things but some are normal. She is using Levsin but is not sure it helps. She is scheduled for a repeat CT on 10/03/11. HX- perforated right ascending colon, IBS Please, advise.

## 2011-10-01 NOTE — Telephone Encounter (Signed)
I have called pt on home and mobile phone, left message to use Levsin and Peptobismol , call if pain gets worse. CT on 10/03/2011

## 2011-10-03 ENCOUNTER — Ambulatory Visit (INDEPENDENT_AMBULATORY_CARE_PROVIDER_SITE_OTHER)
Admission: RE | Admit: 2011-10-03 | Discharge: 2011-10-03 | Disposition: A | Discharge status: Home / Self Care | Payer: BC Managed Care – PPO | Source: Ambulatory Visit | Attending: Internal Medicine | Admitting: Internal Medicine

## 2011-10-03 DIAGNOSIS — R933 Abnormal findings on diagnostic imaging of other parts of digestive tract: Secondary | ICD-10-CM

## 2011-10-03 DIAGNOSIS — K5732 Diverticulitis of large intestine without perforation or abscess without bleeding: Secondary | ICD-10-CM

## 2011-10-03 MED ORDER — IOHEXOL 300 MG/ML  SOLN
100.0000 mL | Freq: Once | INTRAMUSCULAR | Status: AC | PRN
Start: 2011-10-03 — End: 2011-10-03
  Administered 2011-10-03: 100 mL via INTRAVENOUS

## 2011-10-10 ENCOUNTER — Telehealth: Payer: Self-pay | Admitting: *Deleted

## 2011-10-10 ENCOUNTER — Other Ambulatory Visit: Payer: Self-pay | Admitting: Internal Medicine

## 2011-10-10 NOTE — Telephone Encounter (Signed)
Message copied by Daphine Deutscher on Fri Oct 10, 2011 10:40 AM ------      Message from: Hart Carwin      Created: Wed Oct 08, 2011 12:56 PM       I have discussed the results of the CT scan with the pt. She is doing OK. Lot of gas. Eating Yogurt every day.Pulmonary nodulel in LLL of the lung  5 mm noted. Please schedule follow up CT scan of the chest in 1 year.

## 2011-10-10 NOTE — Telephone Encounter (Signed)
Note to remind patient and schedule in one year in EPIC.

## 2011-11-06 ENCOUNTER — Other Ambulatory Visit: Payer: Self-pay | Admitting: Internal Medicine

## 2011-11-07 NOTE — Telephone Encounter (Signed)
-----   Message -----    From: Hart Carwin, MD    Sent: 11/07/2011  12:18 PM      To: Vernia Buff, CMA Subject: Prevacid                                       I  Gave hjer samples of a PPI, for her dyspepsia. She may want a prescription for Prevacid 30 mg which is fine with me. #30. 2 refills. ----- Message -----    From: Vernia Buff, CMA    Sent: 11/07/2011   8:48 AM      To: Hart Carwin, MD  Dr Juanda Chance- This patient requests refills on prevacid. However, I am unable to see where we ever sent a prescription. Looks like the original fill date was 09/03/11 which would correspond to a couple days after her colonoscopy. Do you think you may have given her a handwritten script?

## 2012-02-20 IMAGING — CT CT ABD-PELV W/ CM
2 of 5 series · 16 of 46 positions shown, 18 images · IV contrast (Omnipaque 300)
Comparison: 09/03/2011

CLINICAL DATA: Follow-up colon perforation.  Continued right-sided
abdominal pain.  No history of surgery or cancer

CT ABDOMEN AND PELVIS WITH CONTRAST
TECHNIQUE: Multidetector CT imaging of the abdomen and pelvis was
performed following the standard protocol during bolus
administration of intravenous contrast.
Contrast: 100mL OMNIPAQUE IOHEXOL 300 MG/ML IV SOLN and oral
contrast

[Series 2: abd/ pel 5mm · axial · 0.65mm/px · z∈[-452,-77]mm · 13 of 85 slices shown, 15 images]
[im 5/85  soft-tissue]
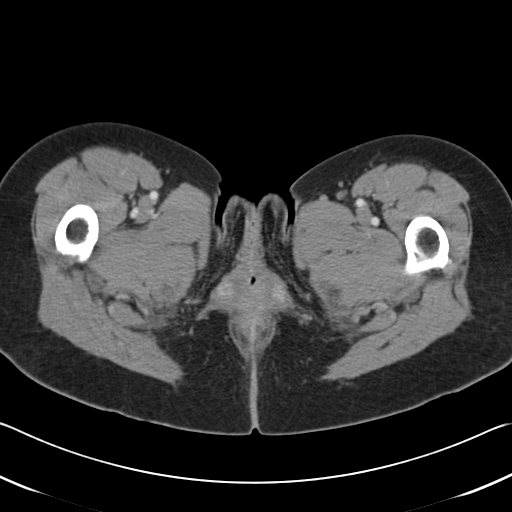
[im 5/85  bone]
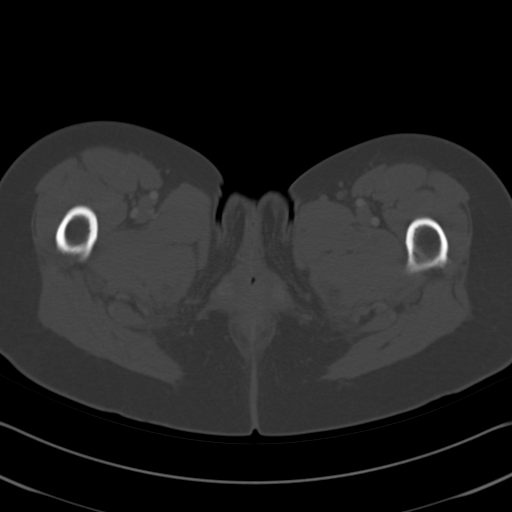
[im 10/85  soft-tissue]
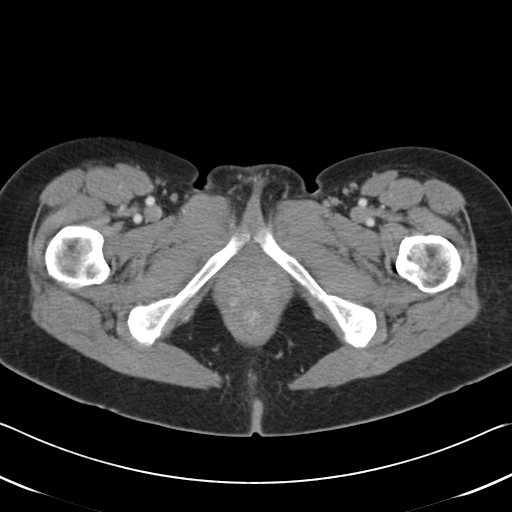
[im 19/85  soft-tissue]
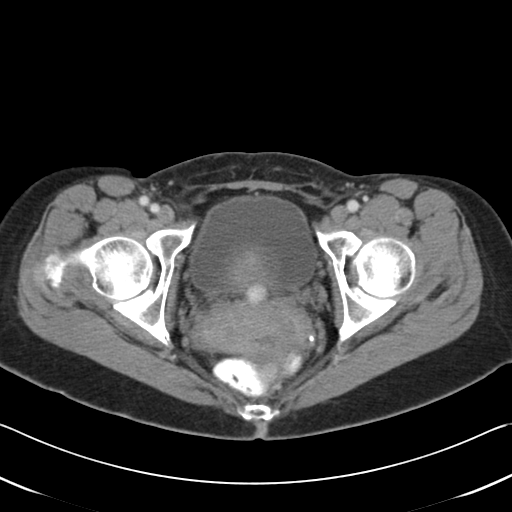
[im 24/85  soft-tissue]
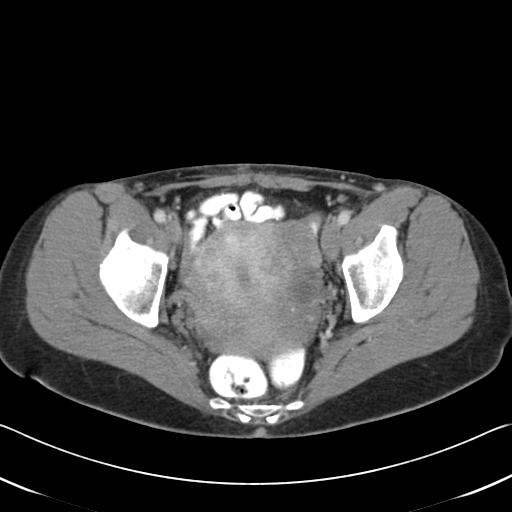
[im 29/85  soft-tissue]
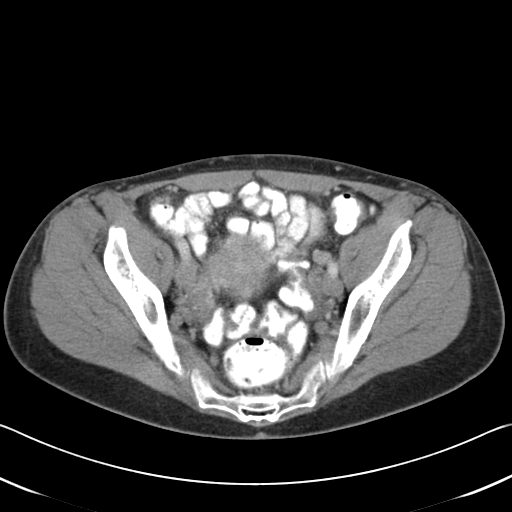
[im 38/85  soft-tissue]
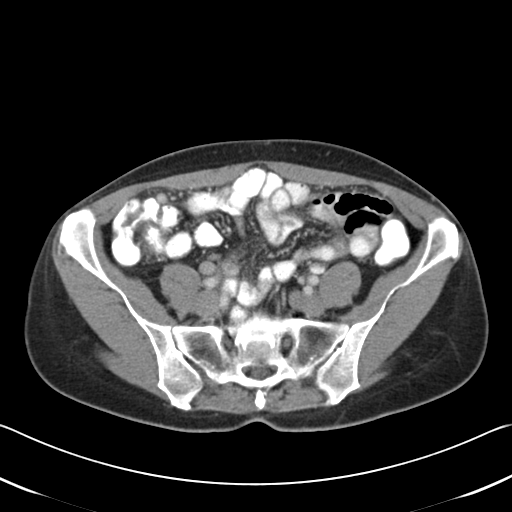
[im 43/85  soft-tissue]
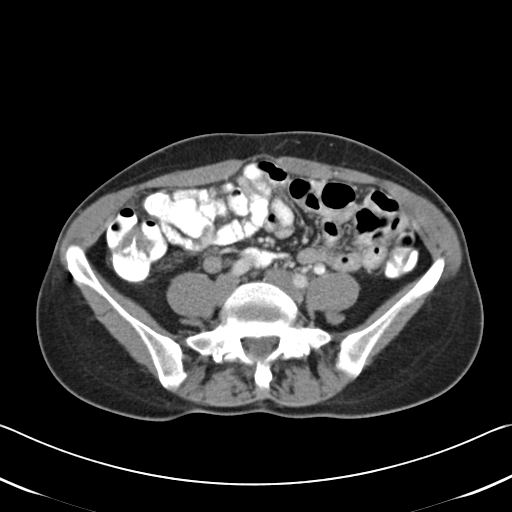
[im 47/85  soft-tissue]
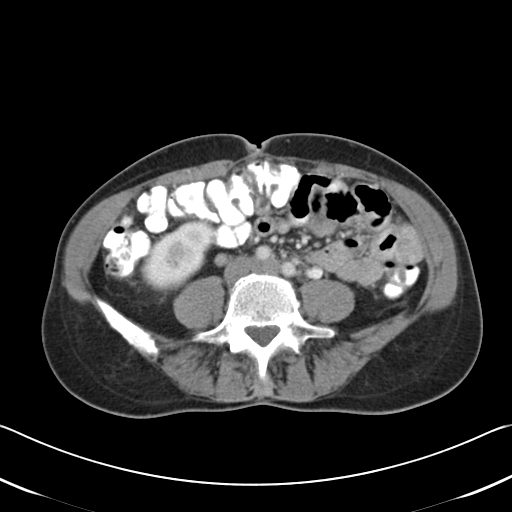
[im 57/85  soft-tissue]
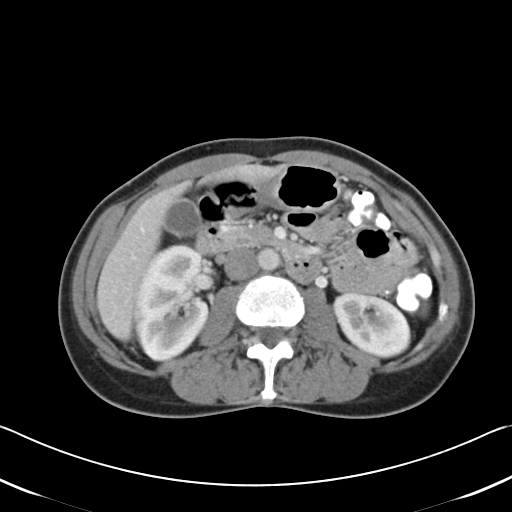
[im 57/85  bone]
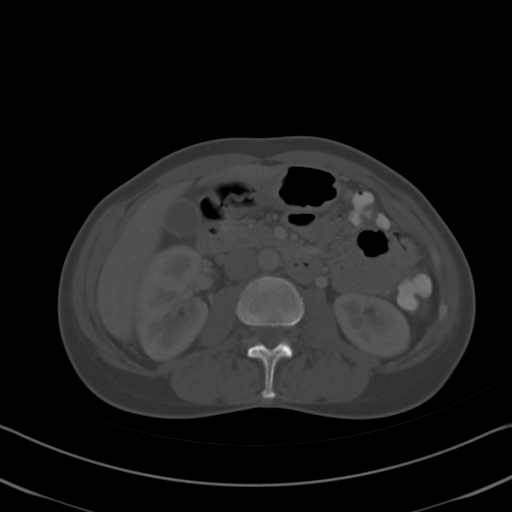
[im 61/85  soft-tissue]
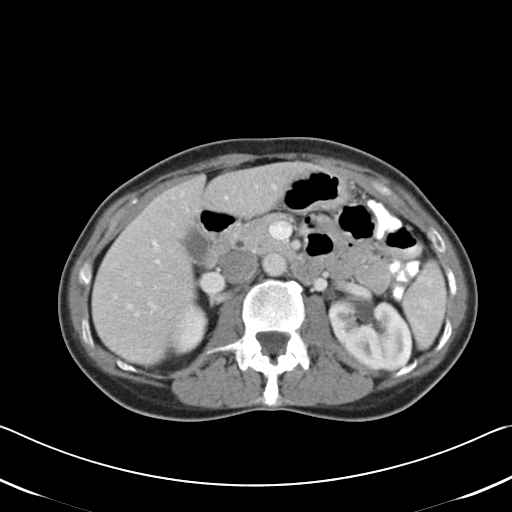
[im 66/85  soft-tissue]
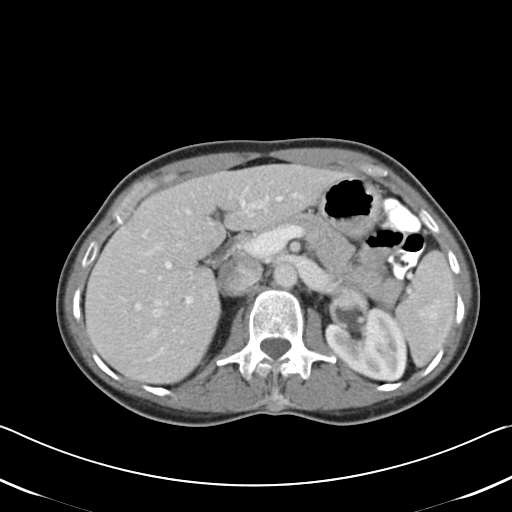
[im 75/85  soft-tissue]
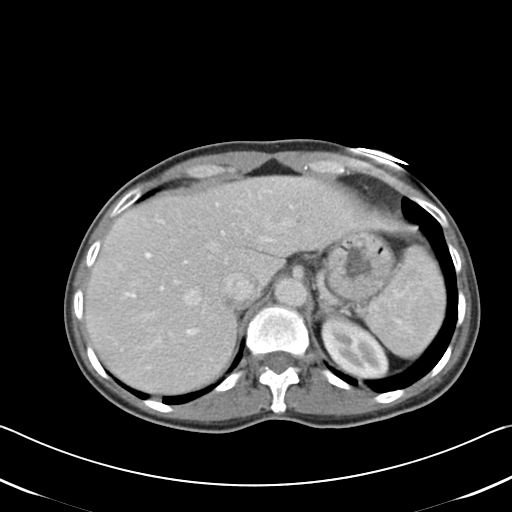
[im 80/85  soft-tissue]
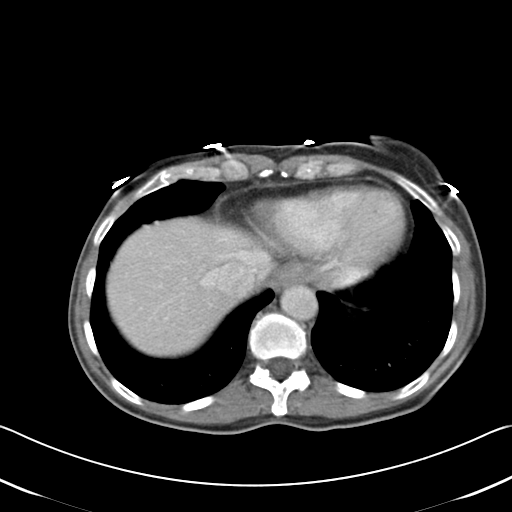

[Series 602: <mpr range> · coronal · 0.85mm/px · 3 of 84 slices shown]
[im 28/84  soft-tissue]
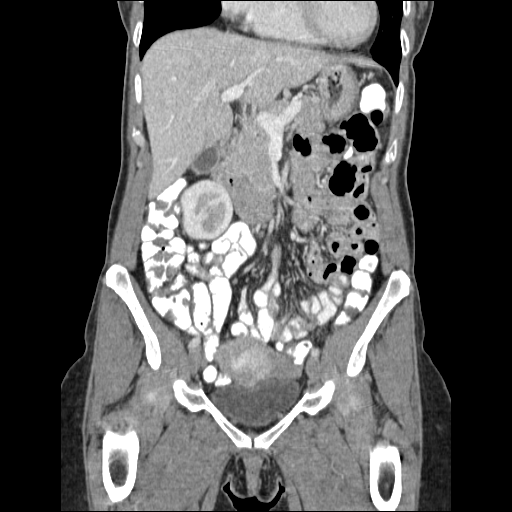
[im 37/84  soft-tissue]
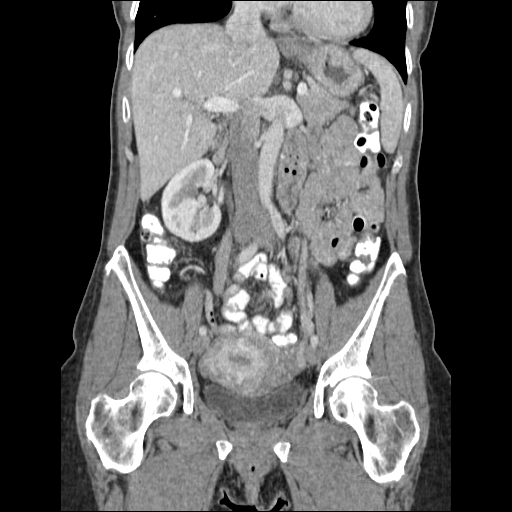
[im 47/84  soft-tissue]
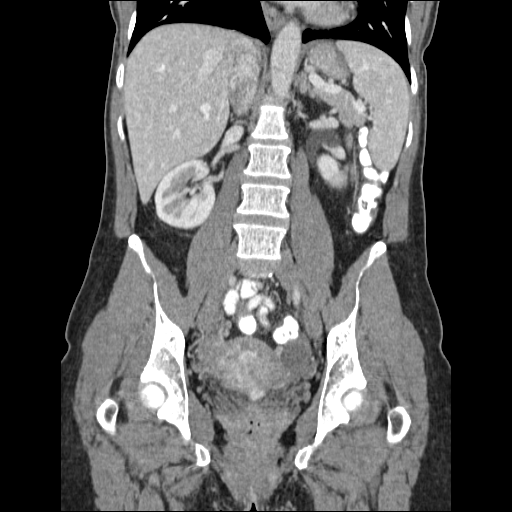

[16 of 46 positions shown; findings below may reference images not displayed]

FINDINGS: A pleural-based nodule is identified associated with the
posterior segment of the left lower lobe measuring 5 mm.  This was
present on the prior exam but is better seen today due to imaging
cuts. Posterior right lower lobe atelectasis is noted.

There has been near complete resolution of the right pericolonic
inflammatory change and wall thickening since the prior two exams.
A focal diverticulum is identified along the lateral aspect of the
ascending colon in the region of the area of prior inflammation
suggesting diverticulitis as the etiology for the previous CT
findings.  Continuity between this diverticulum and the colonic
lumen is best seen on coronal images 36 and 37.  The diverticulum
abuts the peritoneal lining which shows some minimal residual
thickening.  No signs of extraluminal contrast or air are seen on
today's exam. No intra-abdominal fluid is seen

Again seen are several subcentimeter indeterminate low density
areas within the liver. These are too small to characterize but
likely represent cysts.  Bilateral nonobstructing lower pole renal
calculi are again identified. The adrenals, pancreas, gallbladder
and spleen appear normal.

No new focal bowel abnormalities are suggested.  The ileocecal
valve and appendix appear normal. No signs of other colonic
diverticula are seen.

The uterus, vagina, bladder and ovaries have a normal appearance
with interval development of a dominant left ovarian follicle.

No worrisome bone lesions are seen on the bone windows
IMPRESSION: Interval near complete resolution of right pericolonic inflammatory
change with findings today suggesting a colonic diverticulum and
diverticulitis as etiologic for the previous inflammatory changes
and colonic wall thickening.

Left lower lobe pleural-based 5mm pulmonary nodule.  Based on size
and lack of cancer history, follow-up chest CT is recommended in 1
year for initial short-term reassessment. This recommendation
follows the consensus statement: Guidelines for Management of Small
Pulmonary Nodules Detected on CT Scans:  A Statement from the
Available online at:  [URL]
nodule.htm.

Unchanged indeterminate subcentimeter hepatic low density areas and
renal calculi.

No new worrisome findings again

## 2012-05-17 ENCOUNTER — Telehealth: Payer: Self-pay | Admitting: Internal Medicine

## 2012-05-17 NOTE — Telephone Encounter (Signed)
Left a message for patient to call me. 

## 2012-05-18 ENCOUNTER — Other Ambulatory Visit (INDEPENDENT_AMBULATORY_CARE_PROVIDER_SITE_OTHER): Payer: BC Managed Care – PPO

## 2012-05-18 ENCOUNTER — Ambulatory Visit (INDEPENDENT_AMBULATORY_CARE_PROVIDER_SITE_OTHER): Payer: BC Managed Care – PPO | Admitting: Nurse Practitioner

## 2012-05-18 ENCOUNTER — Encounter: Payer: Self-pay | Admitting: Nurse Practitioner

## 2012-05-18 VITALS — BP 104/60 | HR 72 | Ht 64.0 in | Wt 119.8 lb

## 2012-05-18 DIAGNOSIS — R109 Unspecified abdominal pain: Secondary | ICD-10-CM

## 2012-05-18 DIAGNOSIS — R141 Gas pain: Secondary | ICD-10-CM

## 2012-05-18 DIAGNOSIS — R1031 Right lower quadrant pain: Secondary | ICD-10-CM

## 2012-05-18 DIAGNOSIS — R140 Abdominal distension (gaseous): Secondary | ICD-10-CM

## 2012-05-18 DIAGNOSIS — R143 Flatulence: Secondary | ICD-10-CM

## 2012-05-18 LAB — COMPREHENSIVE METABOLIC PANEL
Albumin: 4.3 g/dL (ref 3.5–5.2)
Alkaline Phosphatase: 50 U/L (ref 39–117)
BUN: 12 mg/dL (ref 6–23)
Creatinine, Ser: 0.6 mg/dL (ref 0.4–1.2)
Glucose, Bld: 88 mg/dL (ref 70–99)
Total Bilirubin: 1.1 mg/dL (ref 0.3–1.2)

## 2012-05-18 LAB — URINALYSIS
Ketones, ur: NEGATIVE
Specific Gravity, Urine: <=1.005 (ref 1.000–1.030)
Total Protein, Urine: NEGATIVE
Urine Glucose: NEGATIVE

## 2012-05-18 LAB — CBC WITH DIFFERENTIAL/PLATELET
Basophils Relative: 0.4 % (ref 0.0–3.0)
Eosinophils Absolute: 0.1 10*3/uL (ref 0.0–0.7)
Eosinophils Relative: 1.4 % (ref 0.0–5.0)
HCT: 42 % (ref 36.0–46.0)
Lymphs Abs: 1.4 10*3/uL (ref 0.7–4.0)
MCHC: 33.4 g/dL (ref 30.0–36.0)
MCV: 91.6 fl (ref 78.0–100.0)
Monocytes Absolute: 0.5 10*3/uL (ref 0.1–1.0)
Neutro Abs: 5.3 10*3/uL (ref 1.4–7.7)
RBC: 4.58 Mil/uL (ref 3.87–5.11)
WBC: 7.3 10*3/uL (ref 4.5–10.5)

## 2012-05-18 MED ORDER — ALIGN PO CAPS: 1.0000 | ORAL_CAPSULE | Freq: Every day | ORAL | Status: AC

## 2012-05-18 NOTE — Progress Notes (Signed)
Stephanie Giles [id number] 12-16-1960   HISTORY OR PRESENT ILLNESS : Patient is a 51 year old female known to Dr. Juanda Chance for history of a colon mircroperforation secondary to either an NSAID induced colonic ulcer vrs perforated diverticulum in 2012. She is worked in today for evaluation of bloating which has been present for a few months now. Bloating most pronounced at night. She is also having intermittent dull, nagging pain in right abdomen. BMs are normal. She didn't have any associated nausea until day. Patient is concerned because these are same symtoms as when diagnosed with a colon perforation. She has had some chills but no fever. Two days she started Levsin again, it seems to help to some degree.  No unusual vaginal discharge but she plans to see GYN .  She does has intermittent urinary frequency.    Current Medications, Allergies, Past Medical History, Past Surgical History, Family History and Social History were reviewed in [employer] record.   PHYSICAL EXAMINATION : General:  Well developed  female in no acute distress Head: Normocephalic and atraumatic Eyes:  sclerae anicteric,conjunctive pink. Ears: Normal auditory acuity Neck: Supple, no masses.  Lungs: Clear throughout to auscultation Heart: Regular rate and rhythm; no murmurs heard Abdomen: Soft, nondistended, minimal RLQ tenderness (very distal part of RLQ, almost groin area). No masses or hepatomegaly noted. Normal bowel sounds Musculoskeletal: Symmetrical with no gross deformities  Skin: No lesions on visible extremities Extremities: No edema or deformities noted Neurological: Oriented x 4, grossly nonfocal Cervical Nodes:  No significant cervical adenopathy Psychological:  Alert and cooperative. Normal mood and affect  ASSESSMENT AND PLAN : 1. Lower abdominal pain, bloating. She has intermittent urinary symptoms. Will check a urinalysis. In addition will check some basic labs today. Samples  given for a two-week trial of align for bloating. Patient will call us Friday morning with a condition update (or sooner if worsening symptoms). If no improvement she will be scheduled for CT scan of the abdomen and pelvis given history of #2.  2. History of colonic microperforation secondary to NSAID related ulcer vrs diverticulum.

## 2012-05-18 NOTE — Patient Instructions (Addendum)
Please go to the basement level to have your labs drawn.  We have given you samples of Align probiotic. Take the capsule and break it into "cold" drinks or applesauce for easier swallowing. Call us on Friday if you are not feeling any better, we may order a CT scan.  You can ask for Akyia Borelli and she will talk to [employer] ACNP.

## 2012-05-18 NOTE — Telephone Encounter (Signed)
Patient returned my call. She states she has had right abdominal pain that she describes as nagging, dull for several weeks. The last 4 days, it has increased and she is now feeling pressure and has noticed some abdominal swelling. Hx- diverticulitis, IBS,  Right colon perforation. Scheduled patient with Willette Cluster, NP today at 2:30 PM.

## 2012-05-18 NOTE — Telephone Encounter (Signed)
Left a message for patient to call me again. 

## 2012-05-20 ENCOUNTER — Telehealth: Payer: Self-pay | Admitting: *Deleted

## 2012-05-20 ENCOUNTER — Encounter: Payer: Self-pay | Admitting: Nurse Practitioner

## 2012-05-20 DIAGNOSIS — R140 Abdominal distension (gaseous): Secondary | ICD-10-CM | POA: Insufficient documentation

## 2012-05-20 DIAGNOSIS — R1031 Right lower quadrant pain: Secondary | ICD-10-CM | POA: Insufficient documentation

## 2012-05-20 NOTE — Progress Notes (Signed)
Reviewed and agree with management plans.  Malcolm T. Stark MD FACG  

## 2012-05-20 NOTE — Telephone Encounter (Signed)
Called the patient back and let her know we are trying to avoid another CT scan.  We would like the patient to call us tomorrow with an update.  She thanked me for checking on her.

## 2012-05-20 NOTE — Telephone Encounter (Signed)
Called and asked the patient how she was feeling.  She said she is no worse but still having slight pain in the RLQ area.  Also a little bloated.  She is eating a soft food diet and taking the Aign probiotic.  She said she was getting ready to take a shower and I advised I will let Gunnar Fusi know and we will call her back in a little while.  Gunnar Fusi said her labs and UA were fine, normal.  Gunnar Fusi said we will let it go another day or so.  Gunnar Fusi said we are trying to avoid another CT scan.  Gunnar Fusi wants her to call tomorrow with an update.

## 2012-05-21 NOTE — Telephone Encounter (Signed)
Called patient to check on her and had to leave a voice mail message.  I advised her to please call us if she has any worsening pain, symptoms.  I did advise we have an MD on call over the weekend. She can call our number and the instructions will be given to get MD on call.

## 2012-06-15 ENCOUNTER — Other Ambulatory Visit: Payer: Self-pay | Admitting: *Deleted

## 2012-06-15 ENCOUNTER — Telehealth: Payer: Self-pay | Admitting: *Deleted

## 2012-06-15 DIAGNOSIS — R945 Abnormal results of liver function studies: Secondary | ICD-10-CM

## 2012-06-15 NOTE — Telephone Encounter (Signed)
Patient is calling back to confirm the results and recommendations because [place] the airport. Discussed with patient. Also, she is reporting an itchy rash on legs and feet. She is asking if the Align might cause this. She is on several new medications and she was not sure of the cause of the rash.

## 2012-06-16 ENCOUNTER — Other Ambulatory Visit (INDEPENDENT_AMBULATORY_CARE_PROVIDER_SITE_OTHER): Payer: BC Managed Care – PPO

## 2012-06-16 ENCOUNTER — Other Ambulatory Visit: Payer: Self-pay

## 2012-06-16 DIAGNOSIS — R945 Abnormal results of liver function studies: Secondary | ICD-10-CM

## 2012-06-16 DIAGNOSIS — R7989 Other specified abnormal findings of blood chemistry: Secondary | ICD-10-CM

## 2012-06-16 LAB — FERRITIN: Ferritin: 34.6 ng/mL (ref 10.0–291.0)

## 2012-06-18 LAB — ANTI-SMOOTH MUSCLE ANTIBODY, IGG: Smooth Muscle Ab: 4 U (ref <20)

## 2012-06-21 NOTE — Telephone Encounter (Signed)
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 Please see what other new meds she is taking though unfortunately almost any med can cause itchy. Thanks

## 2012-06-21 NOTE — Telephone Encounter (Signed)
Spoke with patient and gave her Paula Guenther, NP recommendation. 

## 2012-06-24 ENCOUNTER — Encounter: Payer: Self-pay | Admitting: Internal Medicine

## 2012-06-24 ENCOUNTER — Ambulatory Visit (INDEPENDENT_AMBULATORY_CARE_PROVIDER_SITE_OTHER): Payer: BC Managed Care – PPO | Admitting: Internal Medicine

## 2012-06-24 VITALS — BP 90/60 | HR 60 | Ht 63.75 in | Wt 119.2 lb

## 2012-06-24 DIAGNOSIS — R7989 Other specified abnormal findings of blood chemistry: Secondary | ICD-10-CM

## 2012-06-24 DIAGNOSIS — R945 Abnormal results of liver function studies: Secondary | ICD-10-CM

## 2012-06-24 DIAGNOSIS — R1031 Right lower quadrant pain: Secondary | ICD-10-CM

## 2012-06-24 NOTE — Patient Instructions (Addendum)
Your physician has requested that you go to the basement for the following lab work before leaving today: LFT's Dr Juanda Chance has given you samples of Align. This puts good bacteria back into your colon. You should take 1 capsule by mouth once daily. If this works well for you, it can be purchased over the counter. You have been scheduled for an abdominal ultrasound at [facility] (1st floor of hospital) on 06/25/12 at 11:30 am. Please arrive 15 minutes prior to your appointment for registration. Make certain not to have anything to eat or drink 6 hours prior to your appointment. Should you need to reschedule your appointment, please contact radiology at [phone]. CC: Dr Annamaria Helling

## 2012-06-24 NOTE — Progress Notes (Signed)
Stephanie Giles 03/27/1961 MRN [id number]   History of Present Illness:  This is a 51 year old white female with irritable bowel syndrome and history of a perforated ascending colon  Diverticulum in October 22012.. A colonoscopy showed an ulcer in the mid right colon. She was treated conservatively without requiring surgery. She had a recurrence of right middle quadrant abdominal pain several weeks ago and was seen by Stephanie Cluster, FNP and started on probiotics and a soft diet. Her symptoms have resolved. She is having regular bowel habits. She was found to have abnormal liver function tests with an AST of 44 and ALT 75. Her hepatitis serologies, ANA titer and ferritin were normal. She denies family history of gallbladder disease. She was seen recently by dermatology for a pruritic rash, possibly a reaction to environmental factors or to a medication. The only new medications has been the estrogen patch and progesterone.Hx of lactose intolerance. Sprue profile negative.   Past Medical History  Diagnosis Date  . Irritable bowel   . Lactose intolerance   . Colon ulcer   . Perforation of colon   . Perimenopausal    Past Surgical History  Procedure Date  . Cesarean section 1987  . Tonsillectomy   . Colonoscopy 09/01/2011    Procedure: COLONOSCOPY;  Surgeon: Stephanie Carwin, MD;  Location: [facility] ENDOSCOPY;  Service: Endoscopy;  Laterality: N/A;  . Esophagogastroduodenoscopy     reports that she has never smoked. She has never used smokeless tobacco. She reports that she drinks about 2.4 ounces of alcohol per week. She reports that she does not use illicit drugs. family history includes Breast cancer in her sister; Coronary artery disease in her mother; Ovarian cancer in her mother; and Stroke in her father.  There is no history of Colon cancer and Diabetes. Allergies  Allergen Reactions  . Clarithromycin         Review of Systems: Negative for nausea heartburn dysphagia  The remainder of  the 10 point ROS is negative except as outlined in H&P   Physical Exam: General appearance  Well developed, in no distress. Eyes- non icteric. HEENT nontraumatic, normocephalic. Mouth no lesions, tongue papillated, no cheilosis. Neck supple without adenopathy, thyroid not enlarged, no carotid bruits, no JVD. Lungs Clear to auscultation bilaterally. Cor normal S1, normal S2, regular rhythm, no murmur,  quiet precordium. Abdomen: Soft abdomen with normoactive bowel sounds. Minimal discomfort right middle quadrant. No distention, no tympany, liver edge at costal margin. Rectal: Not done. Extremities no pedal edema. Skin no lesions. Neurological alert and oriented x 3. Psychological normal mood and affect.  Assessment and Plan:  Problem #62 51 year old white female post confined colon perforation from a diverticulum with recent recurrence of symptoms although not as severe, which have resolved on dietary modifications and antibiotics. Interestingly, she has developed transient elevation of liver function tests associated with a pruritic rash. We will repeat her liver function tests today as well as ultrasound of the right upper quadrant to make sure that her symptoms are not related to biliary dysfunction. She will be able to liberalize her diet at this point. I have given her more samples of probiotics to take and she may take Lomotil on an as necessary basis. She will follow up with me as needed.   06/24/2012 Stephanie Giles

## 2012-06-25 ENCOUNTER — Ambulatory Visit (HOSPITAL_COMMUNITY)
Admission: RE | Admit: 2012-06-25 | Discharge: 2012-06-25 | Disposition: A | Discharge status: Home / Self Care | Payer: BC Managed Care – PPO | Source: Ambulatory Visit | Attending: Internal Medicine | Admitting: Internal Medicine

## 2012-06-25 ENCOUNTER — Other Ambulatory Visit (INDEPENDENT_AMBULATORY_CARE_PROVIDER_SITE_OTHER): Payer: BC Managed Care – PPO

## 2012-06-25 DIAGNOSIS — R7989 Other specified abnormal findings of blood chemistry: Secondary | ICD-10-CM

## 2012-06-25 DIAGNOSIS — R945 Abnormal results of liver function studies: Secondary | ICD-10-CM

## 2012-06-25 LAB — HEPATIC FUNCTION PANEL
Alkaline Phosphatase: 35 U/L — ABNORMAL LOW (ref 39–117)
Bilirubin, Direct: 0.1 mg/dL (ref 0.0–0.3)
Total Bilirubin: 1 mg/dL (ref 0.3–1.2)
Total Protein: 6.9 g/dL (ref 6.0–8.3)

## 2012-09-13 ENCOUNTER — Telehealth: Payer: Self-pay | Admitting: *Deleted

## 2012-09-13 DIAGNOSIS — R911 Solitary pulmonary nodule: Secondary | ICD-10-CM

## 2012-09-13 NOTE — Telephone Encounter (Signed)
Message copied by Daphine Deutscher on Mon Sep 13, 2012 11:49 AM ------      Message from: Daphine Deutscher      Created: Fri Oct 10, 2011 10:41 AM       Need to schedule 1 year f/u on pulm nodule LLL for DB 12/13

## 2012-09-13 NOTE — Telephone Encounter (Signed)
Left a message for patient to call me. 

## 2012-09-13 NOTE — Telephone Encounter (Signed)
Scheduled for CT chest at [facility] CT on 09/16/12 at 1:30 PM. NPO 2 hours prior

## 2012-09-14 NOTE — Telephone Encounter (Signed)
Spoke with patient and gave her appointment date, time and instructions. She will need to reschedule due to work. Patient given [place] CT number. She will reschedule.

## 2012-09-16 ENCOUNTER — Ambulatory Visit (INDEPENDENT_AMBULATORY_CARE_PROVIDER_SITE_OTHER)
Admission: RE | Admit: 2012-09-16 | Discharge: 2012-09-16 | Disposition: A | Discharge status: Home / Self Care | Payer: BC Managed Care – PPO | Source: Ambulatory Visit | Attending: Internal Medicine | Admitting: Internal Medicine

## 2012-09-16 DIAGNOSIS — R911 Solitary pulmonary nodule: Secondary | ICD-10-CM

## 2012-09-16 MED ORDER — IOHEXOL 300 MG/ML  SOLN
80.0000 mL | Freq: Once | INTRAMUSCULAR | Status: AC | PRN
  Administered 2012-09-16: 80 mL via INTRAVENOUS

## 2012-10-19 ENCOUNTER — Other Ambulatory Visit: Payer: Self-pay | Admitting: *Deleted

## 2012-10-19 MED ORDER — HYOSCYAMINE SULFATE 0.125 MG SL SUBL: 0.1250 mg | SUBLINGUAL_TABLET | SUBLINGUAL | Status: DC | PRN

## 2013-03-02 ENCOUNTER — Telehealth: Payer: Self-pay | Admitting: Internal Medicine

## 2013-03-02 NOTE — Telephone Encounter (Signed)
Please take Prevacid 40 mg po every day x 2 weeks, then prn, also start Levbid.375 mg, #20, 1/2 po bid x 2 weeks, then 1/2 po qd till gone. Call with an update.

## 2013-03-02 NOTE — Telephone Encounter (Signed)
Patient calling to report bloating, cramping and a "hormonal thing" for the past month. She saw her GYN for labs and ultrasound. Everything was normal. She continues to have bloating, cramping and a lot of burping. Denies diarrhea, nausea or medication change. She has noticed more frequent bowel movements. She is taking Align, Prevacid PRN and hyoscyamine prn. The Hyoscyamine does help the cramping. Please, advise.

## 2013-03-03 MED ORDER — AMBULATORY NON FORMULARY MEDICATION: Status: DC

## 2013-03-03 MED ORDER — LANSOPRAZOLE 30 MG PO CPDR: DELAYED_RELEASE_CAPSULE | ORAL | Status: DC

## 2013-03-03 NOTE — Telephone Encounter (Signed)
Spoke with patient and gave her Dr. Regino Schultze recommendations. Patient states she cannot swallow pills. Called the pharmacy and Phineas Real is a tablet and is timed released. Prevacid is capsule and can be sprinkled onto food.  Please, advise on Levbid.

## 2013-03-03 NOTE — Telephone Encounter (Signed)
Rx called to pharmacy. Patient notified.

## 2013-03-03 NOTE — Telephone Encounter (Signed)
Donnatal elixir ( pediatric), 120cc, 5 cc=1 tsp po ac and hs,,1 refill

## 2013-03-03 NOTE — Telephone Encounter (Signed)
Left a message for patient to call me. 

## 2013-03-22 ENCOUNTER — Telehealth: Payer: Self-pay | Admitting: Internal Medicine

## 2013-03-22 NOTE — Telephone Encounter (Signed)
Spoke with patient and she states the Prevacid helped the burping slightly. She tried the Donnatal but she was too sleepy with it and felt worse. She is taking Hyoscyamine and it helps a little with cramping. She wants to be seen for cramping, swelling and burping. Offered OV with extender tomorrow but patient could not do this. Scheduled her with Willette Cluster, NP on 03/25/13 at 1:30 PM.

## 2013-03-22 NOTE — Telephone Encounter (Signed)
Tell Stephanie Giles I wanted to come in to see the pt with her.

## 2013-03-23 NOTE — Telephone Encounter (Signed)
Gunnar Fusi please see the note below from Dr. Juanda Chance.

## 2013-03-25 ENCOUNTER — Ambulatory Visit (INDEPENDENT_AMBULATORY_CARE_PROVIDER_SITE_OTHER): Payer: BC Managed Care – PPO | Admitting: Internal Medicine

## 2013-03-25 ENCOUNTER — Encounter: Payer: Self-pay | Admitting: Internal Medicine

## 2013-03-25 ENCOUNTER — Ambulatory Visit: Payer: BC Managed Care – PPO | Admitting: Nurse Practitioner

## 2013-03-25 VITALS — BP 90/60 | HR 72 | Ht 64.0 in | Wt 123.5 lb

## 2013-03-25 DIAGNOSIS — K589 Irritable bowel syndrome without diarrhea: Secondary | ICD-10-CM

## 2013-03-25 MED ORDER — ALPRAZOLAM 0.5 MG PO TABS: 0.5000 mg | ORAL_TABLET | Freq: Every day | ORAL | Status: DC | PRN

## 2013-03-25 MED ORDER — LUBIPROSTONE 8 MCG PO CAPS: 8.0000 ug | ORAL_CAPSULE | Freq: Every day | ORAL | Status: DC

## 2013-03-25 MED ORDER — SUCRALFATE 1 GM/10ML PO SUSP: 1.0000 g | Freq: Two times a day (BID) | ORAL | Status: DC

## 2013-03-25 NOTE — Patient Instructions (Addendum)
We have sent the following medications to your pharmacy for you to pick up at your convenience: Carafate.  We have given you samples of Amitiza 8 mcg to take one tablet by mouth once daily with breakfast.  Please call back in 2 weeks to let Dr. Juanda Chance with a update on symptoms.   cc: Annamaria Helling, MD

## 2013-03-25 NOTE — Progress Notes (Signed)
Stephanie Giles 1960/10/15 MRN [id number]        History of Present Illness:  This is a 52 year old white female with severe irritable bowel syndrome ,post diverticular perforation with abscess in October 2012 localized to right colon and treated conservatively. She has lactose intolerance. Her sprue profile has been negative. She is unable to swallow pills, all her medications have to be either sublingual or in a  liquid form. Her complaint today is increased abdominal distention and bloating for past 3 weeks. Her diet has not changed and her weight has not changed either, she is having regular bowel habits. The abdominal distention progresses during the day .She is on a low-fat bland diet. Last colonoscopy in November 2012 showed  ulcerations in the descending colon consistent with healing perforation. She has a history of abnormal liver function test which have last time normalized. Her ultrasound of the gallbladder in September 2013 and in 2008 were normal. Last CT scan of the abdomen in December 2012 showed complete resolution of the thickening in the ascending colon; she has been belching excessively but has been improved by taking Prevacid 30 mg daily. She also takes  probiotic daily   Past Medical History  Diagnosis Date  . Irritable bowel   . Lactose intolerance   . Colon ulcer   . Perforation of colon   . Perimenopausal    Past Surgical History  Procedure Laterality Date  . Cesarean section  1987  . Tonsillectomy    . Colonoscopy  09/01/2011    Procedure: COLONOSCOPY;  Surgeon: Hart Carwin, MD;  Location: [facility] ENDOSCOPY;  Service: Endoscopy;  Laterality: N/A;  . Esophagogastroduodenoscopy      reports that she has never smoked. She has never used smokeless tobacco. She reports that she drinks about 2.4 ounces of alcohol per week. She reports that she does not use illicit drugs. family history includes Breast cancer in her sister; Coronary artery disease in her mother; Ovarian  cancer in her mother; and Stroke in her father.  There is no history of Colon cancer and Diabetes. Allergies  Allergen Reactions  . Clarithromycin         Review of Systems: Denies heartburn dysphagia weight loss rectal bleeding  The remainder of the 10 point ROS is negative except as outlined in H&P   Physical Exam: General appearance  Well developed, in no distress. Eyes- non icteric. HEENT nontraumatic, normocephalic. Mouth no lesions, tongue papillated, no cheilosis. Neck supple without adenopathy, thyroid not enlarged, no carotid bruits, no JVD. Lungs Clear to auscultation bilaterally. Cor normal S1, normal S2, regular rhythm, no murmur,  quiet precordium. Abdomen: When standing her abdomen was slightly protuberant. In lying down position abdomen was relaxed with quiet bowel sounds and small rectus diathesis, there was diffuse mild tenderness in the crit epigastrium and right lower quadrant, liver edge wasn't costal margin Rectal: Not done Extremities no pedal edema. Skin no lesions. Neurological alert and oriented x 3. Psychological normal mood and affect.  Assessment and Plan:  52 year old white female with irritable bowel syndrome with recent exacerbation. Dyspepsia has been somewhat improved on Prevacid. She may have gastritis. We will add Carafate slurry 10 cc by mouth twice a day. For the irritable bowel syndrome we will Amitiza 8 mcg daily. She will call us in 2 weeks and if no improvement we will proceed with upper endoscopy and small bowel biopsies she will continue on probiotic and we will refill her Xanax.   03/25/2013 Stephanie Giles

## 2013-09-13 ENCOUNTER — Other Ambulatory Visit: Payer: Self-pay | Admitting: *Deleted

## 2013-09-13 ENCOUNTER — Telehealth: Payer: Self-pay | Admitting: *Deleted

## 2013-09-13 DIAGNOSIS — R918 Other nonspecific abnormal finding of lung field: Secondary | ICD-10-CM

## 2013-09-13 NOTE — Telephone Encounter (Signed)
Message copied by Daphine Deutscher on Tue Sep 13, 2013  1:47 PM ------      Message from: Daphine Deutscher      Created: Fri Sep 17, 2012 10:16 AM       Patient needs to have CT chest 12/13 to f/u on lung nodules DB ------

## 2013-09-13 NOTE — Telephone Encounter (Signed)
Scheduled CT chest at [facility] radiology on 09/22/13 at 3:00 PM. NPO 4 hours prior. Patient given appointment date, time and instructions. She will call radiology to change the appointment due to conflict.

## 2013-09-22 ENCOUNTER — Ambulatory Visit (HOSPITAL_COMMUNITY): Payer: BC Managed Care – PPO

## 2013-10-19 ENCOUNTER — Encounter (HOSPITAL_COMMUNITY): Payer: Self-pay

## 2013-10-19 ENCOUNTER — Ambulatory Visit (HOSPITAL_COMMUNITY)
Admission: RE | Admit: 2013-10-19 | Discharge: 2013-10-19 | Disposition: A | Discharge status: Home / Self Care | Payer: BC Managed Care – PPO | Source: Ambulatory Visit | Attending: Internal Medicine | Admitting: Internal Medicine

## 2013-10-19 DIAGNOSIS — M954 Acquired deformity of chest and rib: Secondary | ICD-10-CM | POA: Insufficient documentation

## 2013-10-19 DIAGNOSIS — Z85038 Personal history of other malignant neoplasm of large intestine: Secondary | ICD-10-CM | POA: Insufficient documentation

## 2013-10-19 DIAGNOSIS — R918 Other nonspecific abnormal finding of lung field: Secondary | ICD-10-CM

## 2013-10-19 DIAGNOSIS — I2510 Atherosclerotic heart disease of native coronary artery without angina pectoris: Secondary | ICD-10-CM | POA: Insufficient documentation

## 2013-10-19 DIAGNOSIS — I517 Cardiomegaly: Secondary | ICD-10-CM | POA: Insufficient documentation

## 2013-10-19 MED ORDER — IOHEXOL 300 MG/ML  SOLN
80.0000 mL | Freq: Once | INTRAMUSCULAR | Status: AC | PRN
  Administered 2013-10-19: 80 mL via INTRAVENOUS

## 2014-10-24 ENCOUNTER — Telehealth: Payer: Self-pay | Admitting: Internal Medicine

## 2014-10-24 NOTE — Telephone Encounter (Signed)
Spoke with patient and she states for the last 3 days, she has had bright, red blood on the tissue when she wipes. Today, she had bright, red blood on the stool. She also reports minor abdominal pain. States she had the flu recently too(respiratory symptoms, body aches) Offered OV with extender tomorrow but she cannot come until Thursday. Scheduled with Alonza Bogus, PA on 10/26/14 at 3:00 PM.

## 2014-10-26 ENCOUNTER — Ambulatory Visit (INDEPENDENT_AMBULATORY_CARE_PROVIDER_SITE_OTHER): Payer: BLUE CROSS/BLUE SHIELD | Admitting: Gastroenterology

## 2014-10-26 ENCOUNTER — Encounter: Payer: Self-pay | Admitting: Gastroenterology

## 2014-10-26 ENCOUNTER — Encounter (INDEPENDENT_AMBULATORY_CARE_PROVIDER_SITE_OTHER): Payer: BLUE CROSS/BLUE SHIELD

## 2014-10-26 VITALS — BP 92/70 | HR 84 | Ht 64.0 in | Wt 117.0 lb

## 2014-10-26 DIAGNOSIS — K625 Hemorrhage of anus and rectum: Secondary | ICD-10-CM

## 2014-10-26 DIAGNOSIS — R1031 Right lower quadrant pain: Secondary | ICD-10-CM

## 2014-10-26 DIAGNOSIS — K602 Anal fissure, unspecified: Secondary | ICD-10-CM

## 2014-10-26 LAB — CBC WITH DIFFERENTIAL/PLATELET
Basophils Absolute: 0 10*3/uL (ref 0.0–0.1)
Basophils Relative: 0.5 % (ref 0.0–3.0)
EOS ABS: 0.1 10*3/uL (ref 0.0–0.7)
Eosinophils Relative: 1.3 % (ref 0.0–5.0)
HCT: 41.7 % (ref 36.0–46.0)
Hemoglobin: 13.9 g/dL (ref 12.0–15.0)
LYMPHS ABS: 1.3 10*3/uL (ref 0.7–4.0)
LYMPHS PCT: 20.1 % (ref 12.0–46.0)
MCHC: 33.3 g/dL (ref 30.0–36.0)
MCV: 90 fl (ref 78.0–100.0)
MONO ABS: 0.4 10*3/uL (ref 0.1–1.0)
MONOS PCT: 6.2 % (ref 3.0–12.0)
NEUTROS PCT: 71.9 % (ref 43.0–77.0)
Neutro Abs: 4.6 10*3/uL (ref 1.4–7.7)
Platelets: 217 10*3/uL (ref 150.0–400.0)
RBC: 4.64 Mil/uL (ref 3.87–5.11)
RDW: 13.1 % (ref 11.5–15.5)
WBC: 6.4 10*3/uL (ref 4.0–10.5)

## 2014-10-26 LAB — COMPREHENSIVE METABOLIC PANEL
ALT: 49 U/L — AB (ref 0–35)
AST: 27 U/L (ref 0–37)
Albumin: 4.4 g/dL (ref 3.5–5.2)
Alkaline Phosphatase: 71 U/L (ref 39–117)
BUN: 14 mg/dL (ref 6–23)
CO2: 33 mEq/L — ABNORMAL HIGH (ref 19–32)
Calcium: 9.8 mg/dL (ref 8.4–10.5)
Chloride: 101 mEq/L (ref 96–112)
Creatinine, Ser: 0.62 mg/dL (ref 0.40–1.20)
GFR: 106.31 mL/min (ref >60.00)
GLUCOSE: 118 mg/dL — AB (ref 70–99)
Potassium: 4.1 mEq/L (ref 3.5–5.1)
Sodium: 137 mEq/L (ref 135–145)
TOTAL PROTEIN: 7.6 g/dL (ref 6.0–8.3)
Total Bilirubin: 0.7 mg/dL (ref 0.2–1.2)

## 2014-10-26 MED ORDER — DILTIAZEM GEL 2 %: 1.0000 "application " | Freq: Two times a day (BID) | CUTANEOUS | Status: DC

## 2014-10-26 MED ORDER — AMOXICILLIN-POT CLAVULANATE 250-62.5 MG/5ML PO SUSR: 500.0000 mg | Freq: Three times a day (TID) | ORAL | Status: DC

## 2014-10-26 NOTE — Patient Instructions (Signed)
Your physician has requested that you go to the basement for the following lab work before leaving today:  CBC, CMET   We have sent the following medications to your pharmacy for you to pick up at your convenience:  Augmentin  We sent Diltiazem gel to [facility]

## 2014-10-27 ENCOUNTER — Telehealth: Payer: Self-pay

## 2014-10-27 NOTE — Telephone Encounter (Signed)
Pharmacy sent over fax to verify dose on liquid augmentin.  Turns out the pharmacy doesn't have the 250-62.5mg /26ml .  Ok per [employer] PA-C to use the Augmentin ES and pharmacy will dose accordingly.

## 2014-10-30 ENCOUNTER — Encounter: Payer: Self-pay | Admitting: Gastroenterology

## 2014-10-30 DIAGNOSIS — Z8719 Personal history of other diseases of the digestive system: Secondary | ICD-10-CM | POA: Insufficient documentation

## 2014-10-30 DIAGNOSIS — K602 Anal fissure, unspecified: Secondary | ICD-10-CM | POA: Insufficient documentation

## 2014-10-30 DIAGNOSIS — K625 Hemorrhage of anus and rectum: Secondary | ICD-10-CM | POA: Insufficient documentation

## 2014-10-30 MED ORDER — HYOSCYAMINE SULFATE 0.125 MG SL SUBL: 0.1250 mg | SUBLINGUAL_TABLET | SUBLINGUAL | Status: DC | PRN

## 2014-10-30 NOTE — Progress Notes (Signed)
     10/26/2014 Stephanie Giles [place] [id number] 10/08/1961   History of Present Illness:  This is a 54 year old female who is known to Dr. Olevia Perches for treatment of IBS.  Also, in 2012 she had a right sided colon microperforation that was questionably related to diverticular disease vs NSAID induced ulcer.  Colonoscopy at that time revealed a benign appearing ulcer in the ascending colon; biopsies showed ulcerated colonic mucosa with prolapse type injury.  She presents to our office today with two different complaints.  First, she complains of RLQ abdominal pain that has been present for the past couple of weeks; not worsening, but just persisting.  Denies any nausea, vomiting, fevers, or chills.  Says that it feels/looks swollen or full on her right side.    Second, she reports rectal bleeding described as bright red blood on the tissue and in the toilet associated with BM's for the past 4 days.  Also describes discomfort in her rectum/anus when moving her bowels as well.     Current Medications, Allergies, Past Medical History, Past Surgical History, Family History and Social History were reviewed in [employer] record.   Physical Exam: BP 92/70 mmHg  Pulse 84  Ht 5\' 4"  (1.626 m)  Wt 117 lb (53.071 kg)  BMI 20.07 kg/m2 General: Well developed white female in no acute distress Head: Normocephalic and atraumatic Eyes:  Sclerae anicteric, conjunctiva pink  Ears: Normal auditory acuity Lungs: Clear throughout to auscultation Heart: Regular rate and rhythm Abdomen: Soft, non-distended.  Normal bowel sounds.  Mild RLQ TTP without R/R/G. Rectal:  No external abnormalities identified.  DRE attempted but not completed due to discomfort.   Musculoskeletal: Symmetrical with no gross deformities  Extremities: No edema  Neurological: Alert oriented x 4, grossly non-focal Psychological:  Alert and cooperative. Normal mood and affect  Assessment and Recommendations: -Rectal  bleeding:  Suspect anal fissure with discomfort on attempted exam, although no definite fissure noted.  Will treat with diltiazem gel 2% BID.  Rectal care instructions discussed. -RLQ abdominal pain:  Has a history of right sided microperforation questionably secondary to diverticulitis in 2012.  Will treat her empirically with a course of antibiotics and see if the pain improves/resolves.  She says that Cipro does not agree with her (was treated as inpatient with flagyl and Zosyn in the past).  Will give her Augmentin for 10 days; she needs to have liquids suspension since she cannot swallow pills (this was prescribed and pharmacy called and will adjust dose to what they have available in their store).  If pain continues or worsens despite this treatment then would need to consider repeat CT scan.  Will check CBC and CMP today.  Will refill levsin at her request as well, which she uses for intermittent abdominal pains related to IBS.

## 2014-11-01 NOTE — Progress Notes (Signed)
Reviewed and agree.

## 2014-11-27 ENCOUNTER — Telehealth: Payer: Self-pay | Admitting: *Deleted

## 2014-11-27 MED ORDER — LANSOPRAZOLE 30 MG PO CPDR: DELAYED_RELEASE_CAPSULE | ORAL | Status: DC

## 2014-11-27 NOTE — Telephone Encounter (Signed)
Rx sent for Lansoprazole (PREVACID), 30 mg, #30, two refills to [facility] at [address], [place], [place] on 11/27/14.

## 2015-04-24 ENCOUNTER — Other Ambulatory Visit: Payer: Self-pay | Admitting: Family Medicine

## 2015-04-24 ENCOUNTER — Ambulatory Visit
Admission: RE | Admit: 2015-04-24 | Discharge: 2015-04-24 | Disposition: A | Discharge status: Home / Self Care | Payer: BLUE CROSS/BLUE SHIELD | Source: Ambulatory Visit | Attending: Family Medicine | Admitting: Family Medicine

## 2015-04-24 DIAGNOSIS — R059 Cough, unspecified: Secondary | ICD-10-CM

## 2015-04-24 DIAGNOSIS — R05 Cough: Secondary | ICD-10-CM

## 2015-04-30 ENCOUNTER — Other Ambulatory Visit: Payer: Self-pay | Admitting: Family Medicine

## 2015-04-30 DIAGNOSIS — R911 Solitary pulmonary nodule: Secondary | ICD-10-CM

## 2015-05-08 ENCOUNTER — Other Ambulatory Visit: Payer: BLUE CROSS/BLUE SHIELD

## 2015-05-08 ENCOUNTER — Inpatient Hospital Stay: Admission: RE | Admit: 2015-05-08 | Payer: BLUE CROSS/BLUE SHIELD | Source: Ambulatory Visit

## 2015-05-09 ENCOUNTER — Ambulatory Visit
Admission: RE | Admit: 2015-05-09 | Discharge: 2015-05-09 | Disposition: A | Discharge status: Home / Self Care | Payer: BLUE CROSS/BLUE SHIELD | Source: Ambulatory Visit | Attending: Family Medicine | Admitting: Family Medicine

## 2015-05-09 DIAGNOSIS — R911 Solitary pulmonary nodule: Secondary | ICD-10-CM

## 2015-07-17 ENCOUNTER — Encounter: Payer: Self-pay | Admitting: Podiatry

## 2015-07-17 ENCOUNTER — Ambulatory Visit (INDEPENDENT_AMBULATORY_CARE_PROVIDER_SITE_OTHER): Payer: BLUE CROSS/BLUE SHIELD

## 2015-07-17 ENCOUNTER — Ambulatory Visit (INDEPENDENT_AMBULATORY_CARE_PROVIDER_SITE_OTHER): Payer: BLUE CROSS/BLUE SHIELD | Admitting: Podiatry

## 2015-07-17 VITALS — BP 105/70 | HR 73 | Resp 16

## 2015-07-17 DIAGNOSIS — S93602A Unspecified sprain of left foot, initial encounter: Secondary | ICD-10-CM

## 2015-07-17 DIAGNOSIS — M779 Enthesopathy, unspecified: Secondary | ICD-10-CM | POA: Diagnosis not present

## 2015-07-17 MED ORDER — TRIAMCINOLONE ACETONIDE 10 MG/ML IJ SUSP
10.0000 mg | Freq: Once | INTRAMUSCULAR | Status: AC
  Administered 2015-07-17: 10 mg

## 2015-07-17 NOTE — Progress Notes (Signed)
   Subjective:    Patient ID: Stephanie Giles, female    DOB: 10/21/1960, 54 y.o.   MRN: [id number]  HPI Pt presents with pain on the dorsal lateral side of her left foot with swelling that she attributes to an injury 1 month prio. Foot did get better but pain returned   Review of Systems  Allergic/Immunologic: Positive for environmental allergies.  All other systems reviewed and are negative.      Objective:   Physical Exam        Assessment & Plan:

## 2015-07-19 NOTE — Progress Notes (Signed)
Subjective:     Patient ID: Stephanie Giles, female   DOB: 12-03-1960, 53 y.o.   MRN: [id number]  HPI patient presents stating that she's been having a lot of pain on the outside of her left foot for several months. States that she did have an injury and it seemed to occur after this   Review of Systems  All other systems reviewed and are negative.      Objective:   Physical Exam  Constitutional: She is oriented to person, place, and time.  Cardiovascular: Intact distal pulses.   Musculoskeletal: Normal range of motion.  Neurological: She is oriented to person, place, and time.  Skin: Skin is warm.  Nursing note and vitals reviewed.  neurovascular status is found to be intact with muscle strength adequate range of motion within normal limits. Patient's noted to have good digital perfusion is well oriented 3 with mild equinus condition and I noted quite a bit of discomfort in the lateral side of the left foot around the sinus tarsi and around the peroneal complex. No indication currently of muscle dysfunction     Assessment:     Inflammatory capsulitis left sinus tarsi with tendinitis symptomatology occurring and no indication of bone injury    Plan:     H&P and x-rays reviewed and injected the lateral side of the joint along with a sinus tarsi injection 3 mg Kenalog 5 mg Xylocaine and dispensed brace to lift up the lateral side of the foot. Reappoint to recheck after her son is married in the next week

## 2015-07-27 ENCOUNTER — Encounter: Payer: Self-pay | Admitting: Podiatry

## 2015-07-27 ENCOUNTER — Ambulatory Visit (INDEPENDENT_AMBULATORY_CARE_PROVIDER_SITE_OTHER): Payer: BLUE CROSS/BLUE SHIELD | Admitting: Podiatry

## 2015-07-27 VITALS — BP 106/63 | HR 67 | Resp 16

## 2015-07-27 DIAGNOSIS — M779 Enthesopathy, unspecified: Secondary | ICD-10-CM

## 2015-07-29 NOTE — Progress Notes (Signed)
Subjective:     Patient ID: Stephanie Giles, female   DOB: 09-29-1961, 54 y.o.   MRN: [id number]  HPI patient states my left foot is doing much better with significant diminishment of discomfort in the ankle with mild lateral pain occasional   Review of Systems     Objective:   Physical Exam Neurovascular status intact muscle strength adequate with significant diminishment of discomfort in the sinus tarsi left with inversion eversion of the ankle that is not painful at the current time with mild lateral peroneal type pain    Assessment:     Doing well from acute sinus tarsitis left with tendinitis still present    Plan:     Advised on supportive shoes physical therapy and to utilize elevation as needed with compression. Reappoint as symptoms indicate

## 2015-11-10 ENCOUNTER — Telehealth: Payer: Self-pay | Admitting: Gastroenterology

## 2015-11-10 MED ORDER — HYOSCYAMINE SULFATE 0.125 MG SL SUBL: 0.1250 mg | SUBLINGUAL_TABLET | SUBLINGUAL | Status: DC | PRN

## 2015-11-10 NOTE — Telephone Encounter (Signed)
On call note. Pt has mild RLQ pain and bloating for past 2-3 days. Symptoms improved with Levsin x 1 last night. Felt a brief chill today but doesn't have a fever. Slight variation in bowel function. She is concerned she might be developing diverticulitis again. Although this could be early diverticulitis it is likely an IBS flare. Advised to increase frequency of Levsin to 1 every 4 hours when symptoms present. If symptoms worsen or fever develops call back for reevaluation.

## 2016-01-17 ENCOUNTER — Telehealth: Payer: Self-pay | Admitting: Gastroenterology

## 2016-01-18 MED ORDER — LANSOPRAZOLE 30 MG PO CPDR: DELAYED_RELEASE_CAPSULE | ORAL | Status: DC

## 2016-01-18 NOTE — Telephone Encounter (Signed)
sure

## 2016-01-18 NOTE — Telephone Encounter (Signed)
Pt requests refills of Prevacid 30 mg 1 po qd. Former [place] pt. She has an appt for 02-19-2016. Can we refill until follow up? Please advise.

## 2016-01-18 NOTE — Telephone Encounter (Signed)
Rx for Prevacid 30 mg refilled for 30 days with 1 refill.

## 2016-02-19 ENCOUNTER — Ambulatory Visit: Payer: BLUE CROSS/BLUE SHIELD | Admitting: Gastroenterology

## 2016-02-27 DIAGNOSIS — E538 Deficiency of other specified B group vitamins: Secondary | ICD-10-CM | POA: Diagnosis not present

## 2016-02-27 DIAGNOSIS — M1990 Unspecified osteoarthritis, unspecified site: Secondary | ICD-10-CM | POA: Diagnosis not present

## 2016-02-27 DIAGNOSIS — M791 Myalgia: Secondary | ICD-10-CM | POA: Diagnosis not present

## 2016-02-27 DIAGNOSIS — R5383 Other fatigue: Secondary | ICD-10-CM | POA: Diagnosis not present

## 2016-02-27 DIAGNOSIS — E559 Vitamin D deficiency, unspecified: Secondary | ICD-10-CM | POA: Diagnosis not present

## 2016-03-28 ENCOUNTER — Encounter: Payer: Self-pay | Admitting: Gastroenterology

## 2016-03-28 ENCOUNTER — Ambulatory Visit (INDEPENDENT_AMBULATORY_CARE_PROVIDER_SITE_OTHER): Payer: BLUE CROSS/BLUE SHIELD | Admitting: Gastroenterology

## 2016-03-28 VITALS — BP 100/58 | HR 64 | Ht 64.0 in | Wt 123.0 lb

## 2016-03-28 DIAGNOSIS — K589 Irritable bowel syndrome without diarrhea: Secondary | ICD-10-CM

## 2016-03-28 DIAGNOSIS — R140 Abdominal distension (gaseous): Secondary | ICD-10-CM | POA: Diagnosis not present

## 2016-03-28 MED ORDER — HYOSCYAMINE SULFATE 0.125 MG SL SUBL: 0.1250 mg | SUBLINGUAL_TABLET | SUBLINGUAL | Status: DC | PRN

## 2016-03-28 NOTE — Progress Notes (Signed)
[facility] Progress Note  Chief Complaint: IBS  Subjective History:  This patient follows up for chronic IBS and belching. I reviewed her last clinic note with our PA in January 2014. Prior to that, Stephanie Giles had seen Dr. Olevia Perches. She reports multiple food intolerances. If she is careful about that, she mostly feels well. She tends to belch frequently after eating. We discussed how that is nearly always from aerophagia. She has episodic flares of the IBS with lower cramps and diarrhea, and this seems to respond well to as needed use of hyoscyamine She denies rectal bleeding or weight loss.  ROS: Cardiovascular:  no chest pain Respiratory: no dyspnea  The patient's Past Medical, Family and Social History were reviewed and are on file in the EMR. She had an episode ever years ago of right-sided abdominal pain requiring hospital stay, and appeared to have a microperforation from either diverticulitis or right sided colonic ulcer Objective:  Med list reviewed  Vital signs in last 24 hrs: Filed Vitals:   03/28/16 1103  BP: 100/58  Pulse: 64    Physical Exam   HEENT: sclera anicteric, oral mucosa moist without lesions  Neck: supple, no thyromegaly, JVD or lymphadenopathy  Cardiac: RRR without murmurs, S1S2 heard, no peripheral edema  Pulm: clear to auscultation bilaterally, normal RR and effort noted  Abdomen: soft, Mild RLQ tenderness, with active bowel sounds. No guarding or palpable hepatosplenomegaly.  Skin; warm and dry, no jaundice or rash    @ASSESSMENTPLANBEGIN @ Assessment: Encounter Diagnoses  Name Primary?  . IBS (irritable bowel syndrome) Yes  . Bloating     Symptoms stable on current management.  Plan:  Stop ppi, as I don't think that is helping her much Refilled hyoscyamine See me in a year or sooner as needed   Stephanie Giles III

## 2016-03-28 NOTE — Patient Instructions (Addendum)
Follow up as needed.  We have sent the following medications to your pharmacy for you to pick up at your convenience: Prevacid and Levsin  Thank you for choosing [facility]  Dr Wilfrid Lund III

## 2016-05-06 DIAGNOSIS — Z0000 Encounter for general adult medical examination without abnormal findings: Secondary | ICD-10-CM | POA: Diagnosis not present

## 2016-05-06 DIAGNOSIS — E785 Hyperlipidemia, unspecified: Secondary | ICD-10-CM | POA: Diagnosis not present

## 2016-05-06 DIAGNOSIS — E559 Vitamin D deficiency, unspecified: Secondary | ICD-10-CM | POA: Diagnosis not present

## 2016-06-03 DIAGNOSIS — Z01419 Encounter for gynecological examination (general) (routine) without abnormal findings: Secondary | ICD-10-CM | POA: Diagnosis not present

## 2016-06-03 DIAGNOSIS — Z6821 Body mass index (BMI) 21.0-21.9, adult: Secondary | ICD-10-CM | POA: Diagnosis not present

## 2016-06-03 DIAGNOSIS — R300 Dysuria: Secondary | ICD-10-CM | POA: Diagnosis not present

## 2016-06-03 DIAGNOSIS — Z1231 Encounter for screening mammogram for malignant neoplasm of breast: Secondary | ICD-10-CM | POA: Diagnosis not present

## 2016-06-25 ENCOUNTER — Encounter: Payer: Self-pay | Admitting: Gastroenterology

## 2016-09-16 ENCOUNTER — Telehealth: Payer: Self-pay | Admitting: Gastroenterology

## 2016-09-16 NOTE — Telephone Encounter (Signed)
I recall discussing this with her and reviewing those records.  At the time, Dr Olevia Perches seemed to think she might have had right-sided diverticulitis.  Since she reports that these symptoms are similar to the 2012 episode, I am going to treat her with antibiotics.  Ciprofloxacin 500 mg twice daily x 7 days Metronidazole 500 mg three times daily x 7 days.  Call in a week to let us know how she is.  If not better then, will get CT abdomen/pelvis

## 2016-09-16 NOTE — Telephone Encounter (Signed)
Called patient she reports that for the last 3-4 weeks she has had RLQ abdominal pain, bowel movements have changed, now looser and foul odor. Hx of colon perforation and states that this is how she felt then. She had an URI prior to this, was not on an antibiotic, but has continued to feel chilled, body aches and abdominal pain since then. She denies having a fever. She does take Levsin prn, she was out of town this past weekend and did take the Levsin which helped slow down the bowel movements. Please advise.

## 2016-09-17 ENCOUNTER — Other Ambulatory Visit: Payer: Self-pay

## 2016-09-17 NOTE — Telephone Encounter (Signed)
Spoke to patient, advised of Dr. Loletha Carrow' recommendations to take Cipro and Flagyl for one week. She understands to call back in one week to let us know how she is doing and if not better at that time will need a CT abd/pelvis. Patient cannot tolerate taking pills requests liquid form of medication or if possible to crush tablets in apple sauce. [employer], spoke to [place] the pharmacist, both medications do not come in liquid form but can be crushed in apple sauce.

## 2016-09-19 ENCOUNTER — Telehealth: Payer: Self-pay

## 2016-09-19 NOTE — Telephone Encounter (Signed)
Patient called today, she cannot tolerate the medications (cipro, flagyl). She can only take liquid forms or crush medications. She has had to crush these and take with apple sauce. She has had head aches, muscle cramps in feet, arms, besides nausea. Please advise of anything else that can be prescribed.

## 2016-09-22 NOTE — Telephone Encounter (Signed)
Spoke to patient, stopped the antibiotics, no longer having headache, muscle cramps or nausea. She is scheduled to see APP, Colletta Maryland., on 12/13. Patient did not want Rx for the ondansetron but appreciates it.

## 2016-09-22 NOTE — Telephone Encounter (Signed)
It's just not clear to me exactly what is going on here. As such, I would like her to stop the antibiotics.  I am afraid I do not have an office appointment available this week.  If one of our APPs does not have an open spot, then I think she should be seen at primary care or a [facility] so a provider can examine her and determine next step.  If she has persistent nausea even after stopping Abx, please send rx for ondansetron 4 mg , one tablet every 6 hours as needed for nausea, Disp #20, RF 1

## 2016-09-24 ENCOUNTER — Encounter (INDEPENDENT_AMBULATORY_CARE_PROVIDER_SITE_OTHER): Payer: Self-pay

## 2016-09-24 ENCOUNTER — Ambulatory Visit (INDEPENDENT_AMBULATORY_CARE_PROVIDER_SITE_OTHER): Payer: BLUE CROSS/BLUE SHIELD | Admitting: Nurse Practitioner

## 2016-09-24 ENCOUNTER — Encounter: Payer: Self-pay | Admitting: Nurse Practitioner

## 2016-09-24 ENCOUNTER — Other Ambulatory Visit (INDEPENDENT_AMBULATORY_CARE_PROVIDER_SITE_OTHER): Payer: BLUE CROSS/BLUE SHIELD

## 2016-09-24 VITALS — BP 116/62 | HR 68 | Ht 64.0 in | Wt 121.0 lb

## 2016-09-24 DIAGNOSIS — R1031 Right lower quadrant pain: Secondary | ICD-10-CM

## 2016-09-24 DIAGNOSIS — R195 Other fecal abnormalities: Secondary | ICD-10-CM

## 2016-09-24 DIAGNOSIS — K589 Irritable bowel syndrome without diarrhea: Secondary | ICD-10-CM | POA: Diagnosis not present

## 2016-09-24 LAB — CBC
HCT: 39.9 % (ref 36.0–46.0)
Hemoglobin: 13.5 g/dL (ref 12.0–15.0)
MCHC: 33.8 g/dL (ref 30.0–36.0)
MCV: 88.2 fl (ref 78.0–100.0)
Platelets: 168 10*3/uL (ref 150.0–400.0)
RBC: 4.52 Mil/uL (ref 3.87–5.11)
RDW: 13.1 % (ref 11.5–15.5)
WBC: 5.7 10*3/uL (ref 4.0–10.5)

## 2016-09-24 LAB — URINALYSIS
BILIRUBIN URINE: NEGATIVE
HGB URINE DIPSTICK: NEGATIVE
Ketones, ur: NEGATIVE
LEUKOCYTES UA: NEGATIVE
NITRITE: NEGATIVE
Specific Gravity, Urine: <=1.005 — AB (ref 1.000–1.030)
TOTAL PROTEIN, URINE-UPE24: NEGATIVE
UROBILINOGEN UA: 0.2 (ref 0.0–1.0)
Urine Glucose: NEGATIVE
pH: 6 (ref 5.0–8.0)

## 2016-09-24 NOTE — Patient Instructions (Signed)
Go to the basement today for labs  Call us back in 1 week with an update or sooner if worse

## 2016-09-24 NOTE — Progress Notes (Addendum)
     HPI: Patient is a 55 year old female previously followed by Dr. Olevia Perches, now under the care of Dr. Loletha Carrow. Patient has a history of chronic IBS. In 2012 she was hospitalized with a right sided colon microperforation  related to diverticular disease versus in-state-induced ulcer.   Patient is here for evaluation of right lower quadrant pain, similar to when hospitalized in 2012. Pain started one month ago. got progressively worse over the last couple of weeks.  Typically has a solid bowel movement every day but in the last few weeks stools became more frequent and loose. Taking Levsin,  has decreased the frequency of bowel movements. No fevers. We prescribed Cipro and metronidazole empirically over the phone on 12/5. She took 3 days of antibiotics then discontinued them because of headaches, cramping in her toes and fingers. She is here for follow-up.  The right lower quadrant pain is no longer constant nor severe. Her stools are mixture of loose and solid pieces. On a soft diet. She has urinary frequency without dysuria. Diagnosed with UTI couple of months ago didn't complete antibiotics-has hard time swallowing pills.   Past Medical History:  Diagnosis Date  . Colon ulcer   . Diverticulitis   . Irritable bowel   . Lactose intolerance   . Perforation of colon ([place])   . Perimenopausal    Patient's surgical history, family medical history, social history, medications and allergies were all reviewed in Epic    Physical Exam: BP 116/62   Pulse 68   Ht 5\' 4"  (1.626 m)   Wt 121 lb (54.9 kg)   BMI 20.77 kg/m   GENERAL: well developed white female in NAD PSYCH: :Pleasant, cooperative, normal affect HEENT: Normocephalic, conjunctiva pink, mucous membranes moist, neck supple without masses CARDIAC:  RRR, no murmur heard, no peripheral edema PULM: Normal respiratory effort, lungs CTA bilaterally, no wheezing ABDOMEN:  soft,  nondistended, localized RLQ tenderness. No obvious masses, no  hepatomegaly,  normal bowel sounds. Negative psoas and obturator test SKIN:  turgor, no lesions seen Musculoskeletal:  Normal muscle tone,normal strength NEURO: Alert and oriented x 3, no focal neurologic deficits   ASSESSMENT and PLAN:   55 yo female with RLQ pain, loose stools.Similar pain in 2012 awhen she had  A right colon microperforation (? Secondary to diverticulitis vrs NSAID induced ulcer). Tried to treat empirically with antibiotics 12/5 but stopped treatment after 3 days because of headaches and cramps of fingers and toes. Overall patient is actually better despite only 3 days of antibiotics. While diverticulitis not excluded, suspect this was most likely  an IBS flare. -Will check a UA given partially treated UTI a couple of months ago. -CBC today -Continue Levsin -Patient will call us in a few days with a condition update . If not improving, or certainly if symptoms worsen, then will obtain CT scan.    Tye Savoy , NP 09/24/2016, 3:37 PM   Thank you for sending this case to me, and for having me see the patient with you in clinic. I have reviewed the entire note, and the outlined plan is what we discussed.

## 2016-10-08 DIAGNOSIS — D225 Melanocytic nevi of trunk: Secondary | ICD-10-CM | POA: Diagnosis not present

## 2016-10-08 DIAGNOSIS — D1801 Hemangioma of skin and subcutaneous tissue: Secondary | ICD-10-CM | POA: Diagnosis not present

## 2016-10-08 DIAGNOSIS — L821 Other seborrheic keratosis: Secondary | ICD-10-CM | POA: Diagnosis not present

## 2016-10-08 DIAGNOSIS — D2261 Melanocytic nevi of right upper limb, including shoulder: Secondary | ICD-10-CM | POA: Diagnosis not present

## 2017-01-19 DIAGNOSIS — J329 Chronic sinusitis, unspecified: Secondary | ICD-10-CM | POA: Diagnosis not present

## 2017-03-23 ENCOUNTER — Telehealth: Payer: Self-pay | Admitting: Gastroenterology

## 2017-03-23 NOTE — Telephone Encounter (Signed)
Spoke to patient, she has had an increase in bloating, belching. Suggested she come in for further evaluation, she did not want to wait until 7/6 which was Dr. Loletha Carrow' first available.

## 2017-03-25 ENCOUNTER — Ambulatory Visit (INDEPENDENT_AMBULATORY_CARE_PROVIDER_SITE_OTHER): Payer: BLUE CROSS/BLUE SHIELD | Admitting: Gastroenterology

## 2017-03-25 ENCOUNTER — Encounter: Payer: Self-pay | Admitting: Gastroenterology

## 2017-03-25 VITALS — BP 112/78 | HR 68 | Ht 64.0 in | Wt 122.4 lb

## 2017-03-25 DIAGNOSIS — R142 Eructation: Secondary | ICD-10-CM

## 2017-03-25 DIAGNOSIS — R1013 Epigastric pain: Secondary | ICD-10-CM | POA: Diagnosis not present

## 2017-03-25 DIAGNOSIS — K589 Irritable bowel syndrome without diarrhea: Secondary | ICD-10-CM

## 2017-03-25 MED ORDER — LANSOPRAZOLE 30 MG PO TBDP
30.0000 mg | ORAL_TABLET | Freq: Every day | ORAL | 11 refills | Status: DC
Start: 2017-03-25 — End: 2017-04-06

## 2017-03-25 NOTE — Progress Notes (Signed)
03/25/2017 Stephanie Giles [id number] [birth year]   HISTORY OF PRESENT ILLNESS:  This is a 56 year old female who is known to Dr. Loletha Carrow for treatment of her IBS.  She presents to our office today with complaints of epigastric abdominal pain, abdominal bloating and belching, and chest discomfort that she thinks is related to the gas pain. She says that the belching is constant, no matter what she eats. She says that her abdomen gets bloated to the point that she looks like she is pregnant. She says that she's been having discomfort underneath her ribs on both sides that she thinks is from gas pain that is causing irritation to that area. She is taking a daily probiotic, Align.  Was previously on prevacid daily but that was discontinued previously by Dr. Loletha Carrow about a year ago so she is currently only using that prn.  She had an episode several years ago (2012) of right-sided abdominal pain requiring hospital stay, and appeared to have a microperforation from either diverticulitis or right sided colonic ulcer.  Of note, she cannot swallow pills.  Past Medical History:  Diagnosis Date  . Colon ulcer   . Diverticulitis   . Irritable bowel   . Lactose intolerance   . Perforation of colon ([place])   . Perimenopausal    Past Surgical History:  Procedure Laterality Date  . CESAREAN SECTION  1987  . COLONOSCOPY  09/01/2011   Procedure: COLONOSCOPY;  Surgeon: Lafayette Dragon, MD;  Location: [facility] ENDOSCOPY;  Service: Endoscopy;  Laterality: N/A;  . ESOPHAGOGASTRODUODENOSCOPY    . TONSILLECTOMY      reports that she has never smoked. She has never used smokeless tobacco. She reports that she drinks about 2.4 oz of alcohol per week . She reports that she does not use drugs. family history includes Breast cancer in her sister; Coronary artery disease in her mother; Ovarian cancer in her mother and sister; Stroke in her father. Allergies  Allergen Reactions  . Clarithromycin       Outpatient  Encounter Prescriptions as of 03/25/2017  Medication Sig  . ALPRAZolam (XANAX) 0.25 MG tablet   . fluticasone (FLONASE) 50 MCG/ACT nasal spray instill 1 spray into each nostril once daily  . hyoscyamine (LEVSIN SL) 0.125 MG SL tablet Place 1 tablet (0.125 mg total) under the tongue every 4 (four) hours as needed for cramping.  . lansoprazole (PREVACID) 30 MG capsule One daily as needed  . loratadine (CLARITIN) 10 MG tablet Take 10 mg by mouth daily.    . Multiple Vitamins-Minerals (MULTIVITAMINS THER. W/MINERALS) TABS Take 1 tablet by mouth daily.    . Simethicone (GAS-X PO) Take by mouth. Takes as needed  . valACYclovir (VALTREX) 1000 MG tablet Take 1,000 mg by mouth daily. Takes as needed  . vitamin C (ASCORBIC ACID) 500 MG tablet Take 500 mg by mouth daily.     No facility-administered encounter medications on file as of 03/25/2017.      REVIEW OF SYSTEMS  : All other systems reviewed and negative except where noted in the History of Present Illness.   PHYSICAL EXAM: Ht 5\' 2"  (1.575 m)   Wt 122 lb 7 oz (55.5 kg)   BMI 22.39 kg/m  General: Well developed white female in no acute distress Head: Normocephalic and atraumatic Eyes:  Sclerae anicteric, conjunctiva pink. Ears: Normal auditory acuity Lungs: Clear throughout to auscultation; no increased WOB. Heart: Regular rate and rhythm Abdomen: Soft, non-distended. Normal bowel sounds.  Moderate epigastric TTP.  Musculoskeletal: Symmetrical with no gross deformities  Skin: No lesions on visible extremities Extremities: No edema  Neurological: Alert oriented x 4, grossly non-focal Psychological:  Alert and cooperative. Normal mood and affect  ASSESSMENT AND PLAN: -Epigastric abdominal pain and belching/dyspepsia:  I have asked her to begin taking her Prevacid daily. I'm also giving her samples of FDgard to try as well.  She will continue this for about 4-6 weeks and then call our office back with an update on her symptoms. -IBS:   Continue levsin prn.   CC:  Maurice Small, MD

## 2017-03-25 NOTE — Patient Instructions (Signed)
We have sent the following medications to your pharmacy for you to pick up at your convenience: Prevacid solutab daily   We have given you samples of FD gard. You can break these apart and mix them with applesauce.   Call back in 4-6 weeks and let us know how you are feeling. Ask for Katina Degree, RN

## 2017-03-26 NOTE — Progress Notes (Signed)
Thank you for sending this case to me. I have reviewed the entire note, and the outlined plan seems appropriate.  Seems like functional bloating, less likely gastroparesis. If not much improved at follow-up, schedule gastric emptying study.  If normal, low dose buspirone can be helpful in this scenario.  Wilfrid Lund, MD

## 2017-04-03 ENCOUNTER — Telehealth: Payer: Self-pay | Admitting: Emergency Medicine

## 2017-04-03 NOTE — Telephone Encounter (Signed)
Have been trying to contact patient that lansoprazole solutabs are not covered by insurance. Her voicemail is not set up and theres no way to leave a message.

## 2017-04-06 ENCOUNTER — Other Ambulatory Visit: Payer: Self-pay | Admitting: Gastroenterology

## 2017-04-06 NOTE — Telephone Encounter (Signed)
Pt contacted, she is aware RX is not covered for the solutabs. Will continue the Prevacid capsules. Rx refilled as directed by Dr Loletha Carrow.

## 2017-04-06 NOTE — Telephone Encounter (Signed)
If she finds the prevacid helpful, it will just have to be the regular form since insurance will not cover solutabs.  - HD

## 2017-04-06 NOTE — Telephone Encounter (Signed)
Pt was seen by Stephanie Giles on 03-25-2017. Prevacid RX was discontinued at the time of the office visit. New Rx given for Prevacid solutabs was given. Solutabs not covered with insurance. Can we give Prevacid 30 mg one a day.

## 2017-05-12 DIAGNOSIS — E785 Hyperlipidemia, unspecified: Secondary | ICD-10-CM | POA: Diagnosis not present

## 2017-05-12 DIAGNOSIS — Z0000 Encounter for general adult medical examination without abnormal findings: Secondary | ICD-10-CM | POA: Diagnosis not present

## 2017-05-12 DIAGNOSIS — E559 Vitamin D deficiency, unspecified: Secondary | ICD-10-CM | POA: Diagnosis not present

## 2017-05-12 DIAGNOSIS — Z5181 Encounter for therapeutic drug level monitoring: Secondary | ICD-10-CM | POA: Diagnosis not present

## 2017-06-05 DIAGNOSIS — Z1231 Encounter for screening mammogram for malignant neoplasm of breast: Secondary | ICD-10-CM | POA: Diagnosis not present

## 2017-06-05 DIAGNOSIS — Z6821 Body mass index (BMI) 21.0-21.9, adult: Secondary | ICD-10-CM | POA: Diagnosis not present

## 2017-06-30 DIAGNOSIS — Z124 Encounter for screening for malignant neoplasm of cervix: Secondary | ICD-10-CM | POA: Diagnosis not present

## 2017-06-30 DIAGNOSIS — R6882 Decreased libido: Secondary | ICD-10-CM | POA: Diagnosis not present

## 2017-06-30 DIAGNOSIS — Z01419 Encounter for gynecological examination (general) (routine) without abnormal findings: Secondary | ICD-10-CM | POA: Diagnosis not present

## 2017-06-30 DIAGNOSIS — Z6820 Body mass index (BMI) 20.0-20.9, adult: Secondary | ICD-10-CM | POA: Diagnosis not present

## 2017-07-14 DIAGNOSIS — Z6821 Body mass index (BMI) 21.0-21.9, adult: Secondary | ICD-10-CM | POA: Diagnosis not present

## 2017-07-14 DIAGNOSIS — R102 Pelvic and perineal pain: Secondary | ICD-10-CM | POA: Diagnosis not present

## 2017-08-07 DIAGNOSIS — M25552 Pain in left hip: Secondary | ICD-10-CM | POA: Diagnosis not present

## 2017-08-07 DIAGNOSIS — M25551 Pain in right hip: Secondary | ICD-10-CM | POA: Diagnosis not present

## 2017-08-07 DIAGNOSIS — M25561 Pain in right knee: Secondary | ICD-10-CM | POA: Diagnosis not present

## 2017-09-01 ENCOUNTER — Telehealth: Payer: Self-pay | Admitting: Gastroenterology

## 2017-09-01 NOTE — Telephone Encounter (Signed)
Patient had bloody diarrhea yesterday afternoon and evening. She did take a Levsin last night and has not had a bm today. Very little abdominal cramping, rectal bleeding has stopped. She states she has felt dehydrated today, leg cramps, she has been pushing the fluids along with Gatorade and this seems to be helping. Patient was concerned about the amount of blood seen. She states she is not bothered by hemorrhoids, has history of fissure but did not experience the rectal pain as she did previously with the fissure.

## 2017-09-02 ENCOUNTER — Ambulatory Visit: Payer: BLUE CROSS/BLUE SHIELD | Admitting: Gastroenterology

## 2017-09-02 ENCOUNTER — Encounter (INDEPENDENT_AMBULATORY_CARE_PROVIDER_SITE_OTHER): Payer: Self-pay

## 2017-09-02 ENCOUNTER — Encounter: Payer: Self-pay | Admitting: Gastroenterology

## 2017-09-02 VITALS — BP 100/62 | HR 66 | Ht 64.0 in | Wt 121.2 lb

## 2017-09-02 DIAGNOSIS — K580 Irritable bowel syndrome with diarrhea: Secondary | ICD-10-CM | POA: Diagnosis not present

## 2017-09-02 DIAGNOSIS — R142 Eructation: Secondary | ICD-10-CM | POA: Diagnosis not present

## 2017-09-02 DIAGNOSIS — R195 Other fecal abnormalities: Secondary | ICD-10-CM | POA: Diagnosis not present

## 2017-09-02 DIAGNOSIS — K625 Hemorrhage of anus and rectum: Secondary | ICD-10-CM

## 2017-09-02 NOTE — Patient Instructions (Signed)
If you are age 57 or older, your body mass index should be between 23-30. Your Body mass index is 20.8 kg/m. If this is out of the aforementioned range listed, please consider follow up with your Primary Care Provider.  If you are age 24 or younger, your body mass index should be between 19-25. Your Body mass index is 20.8 kg/m. If this is out of the aformentioned range listed, please consider follow up with your Primary Care Provider.   Thank you for choosing [facility]  Dr Wilfrid Lund III

## 2017-09-02 NOTE — Telephone Encounter (Signed)
Spoke to patient, offered her work in appointment for today at 10:00 with either Dr. Loletha Carrow or Colletta Maryland., patient declined appointment as she is currently busy and will not be able to make it. I have scheduled her to see Amy E. on Monday 11/26. Patient states she did have a bowel movement last night, with only a little blood, none today. She understands to if bleeding worsens to contact our office/on call physician or go to ED.

## 2017-09-02 NOTE — Progress Notes (Signed)
[facility] Progress Note  Chief Complaint: Rectal bleeding  Subjective  History:  Stephanie Giles was seen as a sick visit today when she called yesterday complaining of worsening diarrhea and then rectal bleeding.  She admits eating foods that do not agree with her at a holiday party the day before yesterday.  She then had loose stool followed by painless rectal bleeding that lasted through the day yesterday.  She is had another loose stool this morning but with more formed than before, and the bleeding is stopped.  As before, she has symptoms of chronic bloating and belching from her IBS. She has had a previous anal fissure but says this behaved somewhat differently.  She had a colonoscopy with Dr. Lenward Chancellor in November 2012 during an episode of severe right lower quadrant pain, and findings indicated either a right-sided colonic ulcer or perhaps microperforation of diverticulosis.   ROS: Cardiovascular:  no chest pain Respiratory: no dyspnea  The patient's Past Medical, Family and Social History were reviewed and are on file in the EMR.  Objective:  Med list reviewed  Current Outpatient Medications:  .  ALPRAZolam (XANAX) 0.25 MG tablet, , Disp: , Rfl: 0 .  fluticasone (FLONASE) 50 MCG/ACT nasal spray, instill 1 spray into each nostril once daily, Disp: , Rfl: 1 .  hyoscyamine (LEVSIN SL) 0.125 MG SL tablet, Place 1 tablet (0.125 mg total) under the tongue every 4 (four) hours as needed for cramping., Disp: 30 tablet, Rfl: 3 .  lansoprazole (PREVACID) 30 MG capsule, take 1 capsule by mouth once daily if needed, Disp: 30 capsule, Rfl: 1 .  loratadine (CLARITIN) 10 MG tablet, Take 10 mg by mouth daily.  , Disp: , Rfl:  .  Multiple Vitamins-Minerals (MULTIVITAMINS THER. W/MINERALS) TABS, Take 1 tablet by mouth daily.  , Disp: , Rfl:  .  Simethicone (GAS-X PO), Take by mouth. Takes as needed, Disp: , Rfl:  .  valACYclovir (VALTREX) 1000 MG tablet, Take 1,000 mg by mouth daily. Takes as  needed, Disp: , Rfl: 0 .  vitamin C (ASCORBIC ACID) 500 MG tablet, Take 500 mg by mouth daily.  , Disp: , Rfl:    Vital signs in last 24 hrs: Vitals:   09/02/17 1014  BP: 100/62  Pulse: 66  SpO2: 98%    Physical Exam  Exam chaperoned by our MA Stephanie Giles  HEENT: sclera anicteric, oral mucosa moist without lesions  Neck: supple, no thyromegaly, JVD or lymphadenopathy  Cardiac: RRR without murmurs, S1S2 heard, no peripheral edema  Pulm: clear to auscultation bilaterally, normal RR and effort noted  Abdomen: soft, no tenderness, with active bowel sounds. No guarding or palpable hepatosplenomegaly.  Skin; warm and dry, no jaundice or rash Rectal: Normal external, no fissure or tenderness or palpable internal lesions. Anoscopy: Small left lateral proximal anal mucosal tear without bleeding.  No hemorrhoids seen.    @ASSESSMENTPLANBEGIN @ Assessment: Encounter Diagnoses  Name Primary?  . Rectal bleeding   . Loose stools Yes  . Irritable bowel syndrome with diarrhea   . Belching    I believe her underlying IBS was exacerbated by dietary indiscretion causing diarrhea, which has caused self-limited benign anal bleeding   Plan: No further testing needed now, expect this to resolve. The pre-existing IBS symptoms were stable until then.  I have reviewed this with her and reassured her. I have told Stephanie Giles that I certainly want to hear from her if she has continued bleeding, as this might indicate some other problem  and the need for colonoscopy.  I will otherwise see her as needed.    Total time 25 minutes, over half spent in counseling and coordination of care.   Nelida Meuse III

## 2017-09-07 ENCOUNTER — Ambulatory Visit: Payer: BLUE CROSS/BLUE SHIELD | Admitting: Physician Assistant

## 2017-10-29 ENCOUNTER — Telehealth: Payer: Self-pay | Admitting: Gastroenterology

## 2017-10-29 NOTE — Telephone Encounter (Signed)
Spoke to Dr. Loletha Carrow, he suggested she contact her PCP to be evaluated for possible cardiac issues. Called patient back and she will contact them now.

## 2017-10-29 NOTE — Telephone Encounter (Signed)
Must see PCP to be certain no active cardiac issue. Will wait to hear from her after that.

## 2017-10-29 NOTE — Telephone Encounter (Signed)
Patient states that for last month she has had intermittent chest pain, more left sided and recently pain has become worse. Admits to only taking Prevacid prn, but recently on a daily basis. She had some left finger tip tingling the other night, that did stop. Denies any SOB, N/V.

## 2017-11-02 DIAGNOSIS — R079 Chest pain, unspecified: Secondary | ICD-10-CM | POA: Diagnosis not present

## 2017-11-04 ENCOUNTER — Other Ambulatory Visit: Payer: Self-pay | Admitting: Family Medicine

## 2017-11-04 DIAGNOSIS — N644 Mastodynia: Secondary | ICD-10-CM

## 2017-11-12 ENCOUNTER — Ambulatory Visit
Admission: RE | Admit: 2017-11-12 | Discharge: 2017-11-12 | Disposition: A | Discharge status: Home / Self Care | Payer: BLUE CROSS/BLUE SHIELD | Source: Ambulatory Visit | Attending: Family Medicine | Admitting: Family Medicine

## 2017-11-12 ENCOUNTER — Ambulatory Visit: Payer: BLUE CROSS/BLUE SHIELD

## 2017-11-12 DIAGNOSIS — N644 Mastodynia: Secondary | ICD-10-CM | POA: Diagnosis not present

## 2017-11-12 DIAGNOSIS — R922 Inconclusive mammogram: Secondary | ICD-10-CM | POA: Diagnosis not present

## 2018-04-23 DIAGNOSIS — F4323 Adjustment disorder with mixed anxiety and depressed mood: Secondary | ICD-10-CM | POA: Diagnosis not present

## 2018-04-30 DIAGNOSIS — F4323 Adjustment disorder with mixed anxiety and depressed mood: Secondary | ICD-10-CM | POA: Diagnosis not present

## 2018-05-06 DIAGNOSIS — F4323 Adjustment disorder with mixed anxiety and depressed mood: Secondary | ICD-10-CM | POA: Diagnosis not present

## 2018-05-13 DIAGNOSIS — F4323 Adjustment disorder with mixed anxiety and depressed mood: Secondary | ICD-10-CM | POA: Diagnosis not present

## 2018-05-27 DIAGNOSIS — F4323 Adjustment disorder with mixed anxiety and depressed mood: Secondary | ICD-10-CM | POA: Diagnosis not present

## 2018-06-02 DIAGNOSIS — Z5181 Encounter for therapeutic drug level monitoring: Secondary | ICD-10-CM | POA: Diagnosis not present

## 2018-06-02 DIAGNOSIS — E785 Hyperlipidemia, unspecified: Secondary | ICD-10-CM | POA: Diagnosis not present

## 2018-06-02 DIAGNOSIS — Z0000 Encounter for general adult medical examination without abnormal findings: Secondary | ICD-10-CM | POA: Diagnosis not present

## 2018-06-17 DIAGNOSIS — F4323 Adjustment disorder with mixed anxiety and depressed mood: Secondary | ICD-10-CM | POA: Diagnosis not present

## 2018-06-24 DIAGNOSIS — F4323 Adjustment disorder with mixed anxiety and depressed mood: Secondary | ICD-10-CM | POA: Diagnosis not present

## 2018-07-01 DIAGNOSIS — F4323 Adjustment disorder with mixed anxiety and depressed mood: Secondary | ICD-10-CM | POA: Diagnosis not present

## 2018-07-15 DIAGNOSIS — F4323 Adjustment disorder with mixed anxiety and depressed mood: Secondary | ICD-10-CM | POA: Diagnosis not present

## 2018-07-27 DIAGNOSIS — Z1231 Encounter for screening mammogram for malignant neoplasm of breast: Secondary | ICD-10-CM | POA: Diagnosis not present

## 2018-07-27 DIAGNOSIS — R6882 Decreased libido: Secondary | ICD-10-CM | POA: Diagnosis not present

## 2018-07-27 DIAGNOSIS — Z6820 Body mass index (BMI) 20.0-20.9, adult: Secondary | ICD-10-CM | POA: Diagnosis not present

## 2018-07-27 DIAGNOSIS — Z01419 Encounter for gynecological examination (general) (routine) without abnormal findings: Secondary | ICD-10-CM | POA: Diagnosis not present

## 2018-08-03 DIAGNOSIS — D225 Melanocytic nevi of trunk: Secondary | ICD-10-CM | POA: Diagnosis not present

## 2018-08-03 DIAGNOSIS — L821 Other seborrheic keratosis: Secondary | ICD-10-CM | POA: Diagnosis not present

## 2018-08-03 DIAGNOSIS — D2362 Other benign neoplasm of skin of left upper limb, including shoulder: Secondary | ICD-10-CM | POA: Diagnosis not present

## 2018-08-03 DIAGNOSIS — D2271 Melanocytic nevi of right lower limb, including hip: Secondary | ICD-10-CM | POA: Diagnosis not present

## 2018-08-19 DIAGNOSIS — F4323 Adjustment disorder with mixed anxiety and depressed mood: Secondary | ICD-10-CM | POA: Diagnosis not present

## 2018-08-26 DIAGNOSIS — F4323 Adjustment disorder with mixed anxiety and depressed mood: Secondary | ICD-10-CM | POA: Diagnosis not present

## 2018-09-16 DIAGNOSIS — F4323 Adjustment disorder with mixed anxiety and depressed mood: Secondary | ICD-10-CM | POA: Diagnosis not present

## 2018-09-23 DIAGNOSIS — F4323 Adjustment disorder with mixed anxiety and depressed mood: Secondary | ICD-10-CM | POA: Diagnosis not present

## 2018-10-21 DIAGNOSIS — F4323 Adjustment disorder with mixed anxiety and depressed mood: Secondary | ICD-10-CM | POA: Diagnosis not present

## 2018-11-04 DIAGNOSIS — F4323 Adjustment disorder with mixed anxiety and depressed mood: Secondary | ICD-10-CM | POA: Diagnosis not present

## 2018-11-18 DIAGNOSIS — F4323 Adjustment disorder with mixed anxiety and depressed mood: Secondary | ICD-10-CM | POA: Diagnosis not present

## 2018-11-23 DIAGNOSIS — H00011 Hordeolum externum right upper eyelid: Secondary | ICD-10-CM | POA: Diagnosis not present

## 2018-11-25 DIAGNOSIS — F4323 Adjustment disorder with mixed anxiety and depressed mood: Secondary | ICD-10-CM | POA: Diagnosis not present

## 2018-12-02 DIAGNOSIS — F4323 Adjustment disorder with mixed anxiety and depressed mood: Secondary | ICD-10-CM | POA: Diagnosis not present

## 2018-12-09 DIAGNOSIS — F4323 Adjustment disorder with mixed anxiety and depressed mood: Secondary | ICD-10-CM | POA: Diagnosis not present

## 2018-12-22 DIAGNOSIS — H00029 Hordeolum internum unspecified eye, unspecified eyelid: Secondary | ICD-10-CM | POA: Diagnosis not present

## 2018-12-22 DIAGNOSIS — H00023 Hordeolum internum right eye, unspecified eyelid: Secondary | ICD-10-CM | POA: Diagnosis not present

## 2018-12-23 DIAGNOSIS — F4323 Adjustment disorder with mixed anxiety and depressed mood: Secondary | ICD-10-CM | POA: Diagnosis not present

## 2018-12-24 DIAGNOSIS — F4323 Adjustment disorder with mixed anxiety and depressed mood: Secondary | ICD-10-CM | POA: Diagnosis not present

## 2019-01-06 DIAGNOSIS — F4323 Adjustment disorder with mixed anxiety and depressed mood: Secondary | ICD-10-CM | POA: Diagnosis not present

## 2019-01-12 ENCOUNTER — Emergency Department (HOSPITAL_COMMUNITY): Payer: BLUE CROSS/BLUE SHIELD

## 2019-01-12 ENCOUNTER — Encounter (HOSPITAL_COMMUNITY): Payer: Self-pay | Admitting: Emergency Medicine

## 2019-01-12 ENCOUNTER — Emergency Department (HOSPITAL_COMMUNITY)
Admission: EM | Admit: 2019-01-12 | Discharge: 2019-01-12 | Disposition: A | Discharge status: Home / Self Care | Payer: BLUE CROSS/BLUE SHIELD | Attending: Emergency Medicine | Admitting: Emergency Medicine

## 2019-01-12 ENCOUNTER — Other Ambulatory Visit: Payer: Self-pay

## 2019-01-12 DIAGNOSIS — S62336A Displaced fracture of neck of fifth metacarpal bone, right hand, initial encounter for closed fracture: Secondary | ICD-10-CM | POA: Diagnosis not present

## 2019-01-12 DIAGNOSIS — R42 Dizziness and giddiness: Secondary | ICD-10-CM | POA: Diagnosis not present

## 2019-01-12 DIAGNOSIS — Y92002 Bathroom of unspecified non-institutional (private) residence single-family (private) house as the place of occurrence of the external cause: Secondary | ICD-10-CM | POA: Insufficient documentation

## 2019-01-12 DIAGNOSIS — W0110XA Fall on same level from slipping, tripping and stumbling with subsequent striking against unspecified object, initial encounter: Secondary | ICD-10-CM | POA: Insufficient documentation

## 2019-01-12 DIAGNOSIS — S0990XA Unspecified injury of head, initial encounter: Secondary | ICD-10-CM | POA: Diagnosis not present

## 2019-01-12 DIAGNOSIS — Y939 Activity, unspecified: Secondary | ICD-10-CM | POA: Diagnosis not present

## 2019-01-12 DIAGNOSIS — R52 Pain, unspecified: Secondary | ICD-10-CM

## 2019-01-12 DIAGNOSIS — S6291XA Unspecified fracture of right wrist and hand, initial encounter for closed fracture: Secondary | ICD-10-CM | POA: Diagnosis not present

## 2019-01-12 DIAGNOSIS — Z79899 Other long term (current) drug therapy: Secondary | ICD-10-CM | POA: Insufficient documentation

## 2019-01-12 DIAGNOSIS — R110 Nausea: Secondary | ICD-10-CM | POA: Diagnosis not present

## 2019-01-12 DIAGNOSIS — M79641 Pain in right hand: Secondary | ICD-10-CM | POA: Diagnosis not present

## 2019-01-12 DIAGNOSIS — R51 Headache: Secondary | ICD-10-CM | POA: Diagnosis not present

## 2019-01-12 DIAGNOSIS — Y999 Unspecified external cause status: Secondary | ICD-10-CM | POA: Insufficient documentation

## 2019-01-12 DIAGNOSIS — W19XXXA Unspecified fall, initial encounter: Secondary | ICD-10-CM

## 2019-01-12 DIAGNOSIS — S0083XA Contusion of other part of head, initial encounter: Secondary | ICD-10-CM | POA: Diagnosis not present

## 2019-01-12 LAB — COMPREHENSIVE METABOLIC PANEL
ALT: 37 U/L (ref 0–44)
AST: 33 U/L (ref 15–41)
Albumin: 4.3 g/dL (ref 3.5–5.0)
Alkaline Phosphatase: 47 U/L (ref 38–126)
Anion gap: 8 (ref 5–15)
BUN: 15 mg/dL (ref 6–20)
CO2: 27 mmol/L (ref 22–32)
Calcium: 9.4 mg/dL (ref 8.9–10.3)
Chloride: 103 mmol/L (ref 98–111)
Creatinine, Ser: 0.57 mg/dL (ref 0.44–1.00)
GFR calc Af Amer: >60 mL/min (ref >60)
GFR calc non Af Amer: >60 mL/min (ref >60)
Glucose, Bld: 152 mg/dL — ABNORMAL HIGH (ref 70–99)
Potassium: 3.7 mmol/L (ref 3.5–5.1)
Sodium: 138 mmol/L (ref 135–145)
Total Bilirubin: 1.1 mg/dL (ref 0.3–1.2)
Total Protein: 7.2 g/dL (ref 6.5–8.1)

## 2019-01-12 LAB — CBC WITH DIFFERENTIAL/PLATELET
Abs Immature Granulocytes: 0.02 10*3/uL (ref 0.00–0.07)
Basophils Absolute: 0 10*3/uL (ref 0.0–0.1)
Basophils Relative: 0 %
Eosinophils Absolute: 0 10*3/uL (ref 0.0–0.5)
Eosinophils Relative: 0 %
HCT: 41.6 % (ref 36.0–46.0)
Hemoglobin: 13.1 g/dL (ref 12.0–15.0)
Immature Granulocytes: 0 %
Lymphocytes Relative: 10 %
Lymphs Abs: 0.9 10*3/uL (ref 0.7–4.0)
MCH: 29.6 pg (ref 26.0–34.0)
MCHC: 31.5 g/dL (ref 30.0–36.0)
MCV: 94.1 fL (ref 80.0–100.0)
Monocytes Absolute: 0.4 10*3/uL (ref 0.1–1.0)
Monocytes Relative: 4 %
Neutro Abs: 7.8 10*3/uL — ABNORMAL HIGH (ref 1.7–7.7)
Neutrophils Relative %: 86 %
Platelets: 155 10*3/uL (ref 150–400)
RBC: 4.42 MIL/uL (ref 3.87–5.11)
RDW: 12.3 % (ref 11.5–15.5)
WBC: 9.1 10*3/uL (ref 4.0–10.5)
nRBC: 0 % (ref 0.0–0.2)

## 2019-01-12 LAB — I-STAT TROPONIN, ED: Troponin i, poc: 0 ng/mL (ref 0.00–0.08)

## 2019-01-12 NOTE — Discharge Instructions (Signed)
I have provided a referral for hand specialist, please schedule an appointment to follow-up for your hand fracture.  You may keep your Velcro splint on until follow-up.  Your CT head was also negative.

## 2019-01-12 NOTE — ED Notes (Signed)
Patient transported to CT 

## 2019-01-12 NOTE — ED Notes (Signed)
EKG given to EDP,Nanavati,MD., for review. 

## 2019-01-12 NOTE — ED Provider Notes (Signed)
[facility] DEPT Provider Note   CSN: [id number] Arrival date & time: 01/12/19  1733    History   Chief Complaint Chief Complaint  Patient presents with   Fall   Hand Injury    HPI Stephanie Giles is a 58 y.o. female.     58 y.o female with no PMH presents to the ED s/p fall this evening while getting out of shower. She reports bending forward to apply cream to her hair, reports feeling dizzy and falling over landing on her right hand and striking her head against the floor.  She reports laying on the ground for couple of seconds.  Patient reports a similar episode earlier this week and where she bent forward got dizzy but was able to catch herself prior to falling.  Patient was seen at [facility] today where her hand was placed on a splint, she was instructed to follow-up in the emergency department.  She endorses some dizziness at rest, denies any previous history of vertigo.  Does report feeling sensation of spinning in the room when she lays in bed at night.  She has not tried any therapy for her symptoms.  She endorses some nausea, denies any vomiting, chest pain, shortness of breath.      Past Medical History:  Diagnosis Date   Colon ulcer    Diverticulitis    Irritable bowel    Lactose intolerance    Perforation of colon [facility])    Perimenopausal     Patient Active Problem List   Diagnosis Date Noted   Abdominal pain, epigastric 03/25/2017   Belching 03/25/2017   Irritable bowel syndrome 03/25/2017   Rectal bleeding 10/30/2014   Anal fissure 10/30/2014   Bloating 05/20/2012   RLQ abdominal pain 05/20/2012   Colon perforation ([place]) 08/30/2011   Abdominal pain 08/30/2011   Hypokalemia 08/30/2011   UTI (lower urinary tract infection) 08/30/2011    Past Surgical History:  Procedure Laterality Date   CESAREAN SECTION  1987   COLONOSCOPY  09/01/2011   Procedure: COLONOSCOPY;  Surgeon: Lafayette Dragon,  MD;  Location: [facility] ENDOSCOPY;  Service: Endoscopy;  Laterality: N/A;   ESOPHAGOGASTRODUODENOSCOPY     TONSILLECTOMY       OB History   No obstetric history on file.      Home Medications    Prior to Admission medications   Medication Sig Start Date End Date Taking? Authorizing Provider  ALPRAZolam Duanne Moron) 0.25 MG tablet  06/25/15  Yes [provider]  Multiple Vitamins-Minerals (MULTIVITAMINS THER. W/MINERALS) TABS Take 1 tablet by mouth daily.     Yes [provider]  fluticasone (FLONASE) 50 MCG/ACT nasal spray instill 1 spray into each nostril once daily 04/24/15   [provider]  hyoscyamine (LEVSIN SL) 0.125 MG SL tablet Place 1 tablet (0.125 mg total) under the tongue every 4 (four) hours as needed for cramping. 03/28/16   Doran Stabler, MD  lansoprazole (PREVACID) 30 MG capsule take 1 capsule by mouth once daily if needed 04/06/17   Doran Stabler, MD  loratadine (CLARITIN) 10 MG tablet Take 10 mg by mouth daily.      [provider]  Simethicone (GAS-X PO) Take by mouth. Takes as needed    [provider]  valACYclovir (VALTREX) 1000 MG tablet Take 1,000 mg by mouth daily. Takes as needed 06/05/15   [provider]  vitamin C (ASCORBIC ACID) 500 MG tablet Take 500 mg by mouth  daily.      [provider]    Family History Family History  Problem Relation Age of Onset   Coronary artery disease Mother    Ovarian cancer Mother    Stroke Father    Breast cancer Sister 18   Ovarian cancer Sister    Colon cancer Neg Hx    Diabetes Neg Hx     Social History Social History   Tobacco Use   Smoking status: Never Smoker   Smokeless tobacco: Never Used  Substance Use Topics   Alcohol use: Yes    Alcohol/week: 4.0 standard drinks    Types: 4 Glasses of wine per week    Comment: social   Drug use: No     Allergies   Clarithromycin   Review of Systems Review of Systems  Constitutional:  Negative for chills and fever.  HENT: Negative for ear pain and sore throat.   Eyes: Negative for pain and visual disturbance.  Respiratory: Negative for cough and shortness of breath.   Cardiovascular: Negative for chest pain and palpitations.  Gastrointestinal: Positive for nausea. Negative for abdominal pain and vomiting.  Genitourinary: Negative for dysuria and hematuria.  Musculoskeletal: Positive for arthralgias. Negative for back pain.  Skin: Negative for color change and rash.  Neurological: Positive for dizziness and headaches. Negative for seizures and syncope.  All other systems reviewed and are negative.    Physical Exam Updated Vital Signs BP (!) 141/90 (BP Location: Left Arm)    Pulse 75    Temp 98.7 F (37.1 C) (Oral)    Resp 18    SpO2 100%   Physical Exam Vitals signs and nursing note reviewed.  Constitutional:      General: She is not in acute distress.    Appearance: She is well-developed.     Comments: Well-appearing.  HENT:     Head: Normocephalic.      Comments: Goose egg noted to the left forehead.  No abrasion or laceration present.    Mouth/Throat:     Pharynx: No oropharyngeal exudate.  Eyes:     Pupils: Pupils are equal, round, and reactive to light.  Neck:     Musculoskeletal: Normal range of motion.  Cardiovascular:     Rate and Rhythm: Regular rhythm.     Heart sounds: Normal heart sounds.  Pulmonary:     Effort: Pulmonary effort is normal. No respiratory distress.     Breath sounds: Normal breath sounds.  Abdominal:     General: Bowel sounds are normal. There is no distension.     Palpations: Abdomen is soft.     Tenderness: There is no abdominal tenderness.  Musculoskeletal:        General: No tenderness or deformity.       Hands:     Right lower leg: No edema.     Left lower leg: No edema.  Skin:    General: Skin is warm and dry.  Neurological:     Mental Status: She is alert and oriented to person, place, and time.      ED  Treatments / Results  Labs (all labs ordered are listed, but only abnormal results are displayed) Labs Reviewed  CBC WITH DIFFERENTIAL/PLATELET  COMPREHENSIVE METABOLIC PANEL  I-STAT TROPONIN, ED    EKG EKG Interpretation  Date/Time:  Wednesday January 12 2019 19:04:47 EDT Ventricular Rate:  75 PR Interval:    QRS Duration: 85 QT Interval:  392 QTC Calculation: 438 R Axis:   25  Text Interpretation:  Sinus rhythm Low voltage, extremity and precordial leads Abnormal R-wave progression, early transition Nonspecific T abnrm, anterolateral leads No acute changes No old tracing to compare Reconfirmed by Varney Biles [phone]) on 01/12/2019 8:03:58 PM   Radiology Ct Head Wo Contrast  Result Date: 01/12/2019 CLINICAL DATA:  58 year old female with head trauma. EXAM: CT HEAD WITHOUT CONTRAST TECHNIQUE: Contiguous axial images were obtained from the base of the skull through the vertex without intravenous contrast. COMPARISON:  None. FINDINGS: Brain: There is mild age-related atrophy. The gray-white matter discrimination is preserved. There is no acute intracranial hemorrhage. No mass effect or midline shift. No extra-axial fluid collection. Probable 5 mm calcified meningioma along the inner table of the left parietal calvarium. Vascular: No hyperdense vessel or unexpected calcification. Skull: Normal. Negative for fracture or focal lesion. Sinuses/Orbits: No acute finding. Other: None IMPRESSION: No acute intracranial pathology. Electronically Signed   By: Anner Crete M.D.   On: 01/12/2019 19:32   Dg Hand Complete Right  Result Date: 01/12/2019 CLINICAL DATA:  Right hand pain after fall. EXAM: RIGHT HAND - COMPLETE 3+ VIEW COMPARISON:  None. FINDINGS: There is an acute fracture of the fifth metacarpal neck with slight volar angulation and displacement. No additional fracture. No dislocation. Joint spaces are preserved. Mild diffuse osteopenia. Soft tissues are unremarkable. IMPRESSION: 1.  Minimally displaced fracture of the fifth metacarpal neck. Electronically Signed   By: Titus Dubin M.D.   On: 01/12/2019 18:54    Procedures Procedures (including critical care time)  Medications Ordered in ED Medications - No data to display   Initial Impression / Assessment and Plan / ED Course  I have reviewed the triage vital signs and the nursing notes.  Pertinent labs & imaging results that were available during my care of the patient were reviewed by me and considered in my medical decision making (see chart for details).      Patient with no previous medical history presents to the ED status post fall this evening while getting out of the shower.  Reports this is the second episode where she bends forward to apply hair product and reports feeling dizzy when this happened.  She also reports multiple episodes of dizziness while laying in bed at night.  Patient was seen at [facility] and sent to the emergency department for further evaluation.  Right hand x-ray was repeated which showed a  1. Minimally displaced fracture of the fifth metacarpal neck.     Patient is currently on a Velcro splint.  Patient is neurological exam was unremarkable, does have a goose egg to the left side of her forehead, CT head was obtained to rule out any other abnormality as she reported dizziness has been recurrent.  She reports some diagnosing with vertigo, has not tried any therapy for her symptoms.  CT head showed: No acute process.  EKG showed no changes consistent with STEMI or infarct. First Troponin was negative.  8:10 PM patient was informed of results, she understands Dr. Leroy Libman will be following up with her labs.  Patient signed out to Dr. Lendon Colonel at shift change.   Final Clinical Impressions(s) / ED Diagnoses   Final diagnoses:  Fall, initial encounter    ED Discharge Orders    None       Janeece Fitting, Hershal Coria 01/12/19 2010    Varney Biles, MD 01/12/19 [phone]

## 2019-01-12 NOTE — ED Triage Notes (Signed)
Pt reports was bent over putting cream in her hair after her shower and fell. Pt has knot on forearm and has right hand injury. Pt was seen at [facility] and xray showed had fractured hand and was put in splint and sent to ED.

## 2019-01-12 NOTE — ED Notes (Signed)
Pt verbalized discharge instructions and follow up care. Alert and ambulatory. Othro tech applied splint.

## 2019-01-12 NOTE — ED Notes (Signed)
Ortho tech at bedside applying splint. 

## 2019-01-13 DIAGNOSIS — F4323 Adjustment disorder with mixed anxiety and depressed mood: Secondary | ICD-10-CM | POA: Diagnosis not present

## 2019-01-13 DIAGNOSIS — M79644 Pain in right finger(s): Secondary | ICD-10-CM | POA: Diagnosis not present

## 2019-01-17 DIAGNOSIS — H8110 Benign paroxysmal vertigo, unspecified ear: Secondary | ICD-10-CM | POA: Diagnosis not present

## 2019-01-20 DIAGNOSIS — F4323 Adjustment disorder with mixed anxiety and depressed mood: Secondary | ICD-10-CM | POA: Diagnosis not present

## 2019-01-25 DIAGNOSIS — M79641 Pain in right hand: Secondary | ICD-10-CM | POA: Diagnosis not present

## 2019-01-27 DIAGNOSIS — F4323 Adjustment disorder with mixed anxiety and depressed mood: Secondary | ICD-10-CM | POA: Diagnosis not present

## 2019-02-03 DIAGNOSIS — F4323 Adjustment disorder with mixed anxiety and depressed mood: Secondary | ICD-10-CM | POA: Diagnosis not present

## 2019-02-17 DIAGNOSIS — F4323 Adjustment disorder with mixed anxiety and depressed mood: Secondary | ICD-10-CM | POA: Diagnosis not present

## 2019-02-22 DIAGNOSIS — M79641 Pain in right hand: Secondary | ICD-10-CM | POA: Diagnosis not present

## 2019-02-25 DIAGNOSIS — H00021 Hordeolum internum right upper eyelid: Secondary | ICD-10-CM | POA: Diagnosis not present

## 2019-03-02 DIAGNOSIS — M25641 Stiffness of right hand, not elsewhere classified: Secondary | ICD-10-CM | POA: Diagnosis not present

## 2019-03-02 DIAGNOSIS — M25531 Pain in right wrist: Secondary | ICD-10-CM | POA: Diagnosis not present

## 2019-03-02 DIAGNOSIS — M25541 Pain in joints of right hand: Secondary | ICD-10-CM | POA: Diagnosis not present

## 2019-03-02 DIAGNOSIS — M25631 Stiffness of right wrist, not elsewhere classified: Secondary | ICD-10-CM | POA: Diagnosis not present

## 2019-03-03 DIAGNOSIS — F4323 Adjustment disorder with mixed anxiety and depressed mood: Secondary | ICD-10-CM | POA: Diagnosis not present

## 2019-03-08 DIAGNOSIS — M25541 Pain in joints of right hand: Secondary | ICD-10-CM | POA: Diagnosis not present

## 2019-03-08 DIAGNOSIS — M25631 Stiffness of right wrist, not elsewhere classified: Secondary | ICD-10-CM | POA: Diagnosis not present

## 2019-03-08 DIAGNOSIS — M25641 Stiffness of right hand, not elsewhere classified: Secondary | ICD-10-CM | POA: Diagnosis not present

## 2019-03-08 DIAGNOSIS — M25531 Pain in right wrist: Secondary | ICD-10-CM | POA: Diagnosis not present

## 2019-03-10 DIAGNOSIS — M25541 Pain in joints of right hand: Secondary | ICD-10-CM | POA: Diagnosis not present

## 2019-03-10 DIAGNOSIS — M25641 Stiffness of right hand, not elsewhere classified: Secondary | ICD-10-CM | POA: Diagnosis not present

## 2019-03-10 DIAGNOSIS — M25531 Pain in right wrist: Secondary | ICD-10-CM | POA: Diagnosis not present

## 2019-03-10 DIAGNOSIS — M25631 Stiffness of right wrist, not elsewhere classified: Secondary | ICD-10-CM | POA: Diagnosis not present

## 2019-03-15 DIAGNOSIS — M25541 Pain in joints of right hand: Secondary | ICD-10-CM | POA: Diagnosis not present

## 2019-03-15 DIAGNOSIS — M25531 Pain in right wrist: Secondary | ICD-10-CM | POA: Diagnosis not present

## 2019-03-15 DIAGNOSIS — M25641 Stiffness of right hand, not elsewhere classified: Secondary | ICD-10-CM | POA: Diagnosis not present

## 2019-03-15 DIAGNOSIS — M25631 Stiffness of right wrist, not elsewhere classified: Secondary | ICD-10-CM | POA: Diagnosis not present

## 2019-03-17 DIAGNOSIS — M25531 Pain in right wrist: Secondary | ICD-10-CM | POA: Diagnosis not present

## 2019-03-17 DIAGNOSIS — M25641 Stiffness of right hand, not elsewhere classified: Secondary | ICD-10-CM | POA: Diagnosis not present

## 2019-03-17 DIAGNOSIS — M25541 Pain in joints of right hand: Secondary | ICD-10-CM | POA: Diagnosis not present

## 2019-03-17 DIAGNOSIS — M25631 Stiffness of right wrist, not elsewhere classified: Secondary | ICD-10-CM | POA: Diagnosis not present

## 2019-03-17 DIAGNOSIS — F4323 Adjustment disorder with mixed anxiety and depressed mood: Secondary | ICD-10-CM | POA: Diagnosis not present

## 2019-03-22 DIAGNOSIS — M25631 Stiffness of right wrist, not elsewhere classified: Secondary | ICD-10-CM | POA: Diagnosis not present

## 2019-03-22 DIAGNOSIS — M25541 Pain in joints of right hand: Secondary | ICD-10-CM | POA: Diagnosis not present

## 2019-03-22 DIAGNOSIS — M25641 Stiffness of right hand, not elsewhere classified: Secondary | ICD-10-CM | POA: Diagnosis not present

## 2019-03-22 DIAGNOSIS — M25531 Pain in right wrist: Secondary | ICD-10-CM | POA: Diagnosis not present

## 2019-03-29 DIAGNOSIS — M25541 Pain in joints of right hand: Secondary | ICD-10-CM | POA: Diagnosis not present

## 2019-03-29 DIAGNOSIS — M25641 Stiffness of right hand, not elsewhere classified: Secondary | ICD-10-CM | POA: Diagnosis not present

## 2019-03-29 DIAGNOSIS — M25631 Stiffness of right wrist, not elsewhere classified: Secondary | ICD-10-CM | POA: Diagnosis not present

## 2019-03-29 DIAGNOSIS — M25531 Pain in right wrist: Secondary | ICD-10-CM | POA: Diagnosis not present

## 2019-03-31 DIAGNOSIS — F4323 Adjustment disorder with mixed anxiety and depressed mood: Secondary | ICD-10-CM | POA: Diagnosis not present

## 2019-03-31 DIAGNOSIS — M25631 Stiffness of right wrist, not elsewhere classified: Secondary | ICD-10-CM | POA: Diagnosis not present

## 2019-03-31 DIAGNOSIS — M25541 Pain in joints of right hand: Secondary | ICD-10-CM | POA: Diagnosis not present

## 2019-03-31 DIAGNOSIS — M25531 Pain in right wrist: Secondary | ICD-10-CM | POA: Diagnosis not present

## 2019-03-31 DIAGNOSIS — M25641 Stiffness of right hand, not elsewhere classified: Secondary | ICD-10-CM | POA: Diagnosis not present

## 2019-04-05 DIAGNOSIS — M25641 Stiffness of right hand, not elsewhere classified: Secondary | ICD-10-CM | POA: Diagnosis not present

## 2019-04-05 DIAGNOSIS — M25631 Stiffness of right wrist, not elsewhere classified: Secondary | ICD-10-CM | POA: Diagnosis not present

## 2019-04-05 DIAGNOSIS — M25531 Pain in right wrist: Secondary | ICD-10-CM | POA: Diagnosis not present

## 2019-04-05 DIAGNOSIS — M79641 Pain in right hand: Secondary | ICD-10-CM | POA: Diagnosis not present

## 2019-04-05 DIAGNOSIS — M25541 Pain in joints of right hand: Secondary | ICD-10-CM | POA: Diagnosis not present

## 2019-04-07 DIAGNOSIS — M25531 Pain in right wrist: Secondary | ICD-10-CM | POA: Diagnosis not present

## 2019-04-07 DIAGNOSIS — M25541 Pain in joints of right hand: Secondary | ICD-10-CM | POA: Diagnosis not present

## 2019-04-07 DIAGNOSIS — M25641 Stiffness of right hand, not elsewhere classified: Secondary | ICD-10-CM | POA: Diagnosis not present

## 2019-04-07 DIAGNOSIS — M25631 Stiffness of right wrist, not elsewhere classified: Secondary | ICD-10-CM | POA: Diagnosis not present

## 2019-04-12 DIAGNOSIS — M25531 Pain in right wrist: Secondary | ICD-10-CM | POA: Diagnosis not present

## 2019-04-12 DIAGNOSIS — M25541 Pain in joints of right hand: Secondary | ICD-10-CM | POA: Diagnosis not present

## 2019-04-12 DIAGNOSIS — M25631 Stiffness of right wrist, not elsewhere classified: Secondary | ICD-10-CM | POA: Diagnosis not present

## 2019-04-12 DIAGNOSIS — M25641 Stiffness of right hand, not elsewhere classified: Secondary | ICD-10-CM | POA: Diagnosis not present

## 2019-04-13 DIAGNOSIS — M25531 Pain in right wrist: Secondary | ICD-10-CM | POA: Diagnosis not present

## 2019-04-13 DIAGNOSIS — M25641 Stiffness of right hand, not elsewhere classified: Secondary | ICD-10-CM | POA: Diagnosis not present

## 2019-04-13 DIAGNOSIS — M25631 Stiffness of right wrist, not elsewhere classified: Secondary | ICD-10-CM | POA: Diagnosis not present

## 2019-04-13 DIAGNOSIS — M25541 Pain in joints of right hand: Secondary | ICD-10-CM | POA: Diagnosis not present

## 2019-04-14 DIAGNOSIS — F4323 Adjustment disorder with mixed anxiety and depressed mood: Secondary | ICD-10-CM | POA: Diagnosis not present

## 2019-04-19 DIAGNOSIS — M25531 Pain in right wrist: Secondary | ICD-10-CM | POA: Diagnosis not present

## 2019-04-19 DIAGNOSIS — M25631 Stiffness of right wrist, not elsewhere classified: Secondary | ICD-10-CM | POA: Diagnosis not present

## 2019-04-19 DIAGNOSIS — M25541 Pain in joints of right hand: Secondary | ICD-10-CM | POA: Diagnosis not present

## 2019-04-19 DIAGNOSIS — M25641 Stiffness of right hand, not elsewhere classified: Secondary | ICD-10-CM | POA: Diagnosis not present

## 2019-04-26 DIAGNOSIS — M25531 Pain in right wrist: Secondary | ICD-10-CM | POA: Diagnosis not present

## 2019-04-26 DIAGNOSIS — M25631 Stiffness of right wrist, not elsewhere classified: Secondary | ICD-10-CM | POA: Diagnosis not present

## 2019-04-26 DIAGNOSIS — M25541 Pain in joints of right hand: Secondary | ICD-10-CM | POA: Diagnosis not present

## 2019-04-26 DIAGNOSIS — M25641 Stiffness of right hand, not elsewhere classified: Secondary | ICD-10-CM | POA: Diagnosis not present

## 2019-04-28 DIAGNOSIS — F4323 Adjustment disorder with mixed anxiety and depressed mood: Secondary | ICD-10-CM | POA: Diagnosis not present

## 2019-05-03 DIAGNOSIS — M25531 Pain in right wrist: Secondary | ICD-10-CM | POA: Diagnosis not present

## 2019-05-03 DIAGNOSIS — M25631 Stiffness of right wrist, not elsewhere classified: Secondary | ICD-10-CM | POA: Diagnosis not present

## 2019-05-03 DIAGNOSIS — M25641 Stiffness of right hand, not elsewhere classified: Secondary | ICD-10-CM | POA: Diagnosis not present

## 2019-05-03 DIAGNOSIS — M25541 Pain in joints of right hand: Secondary | ICD-10-CM | POA: Diagnosis not present

## 2019-05-05 DIAGNOSIS — M25531 Pain in right wrist: Secondary | ICD-10-CM | POA: Diagnosis not present

## 2019-05-05 DIAGNOSIS — M25631 Stiffness of right wrist, not elsewhere classified: Secondary | ICD-10-CM | POA: Diagnosis not present

## 2019-05-05 DIAGNOSIS — M25541 Pain in joints of right hand: Secondary | ICD-10-CM | POA: Diagnosis not present

## 2019-05-05 DIAGNOSIS — M25641 Stiffness of right hand, not elsewhere classified: Secondary | ICD-10-CM | POA: Diagnosis not present

## 2019-05-10 DIAGNOSIS — M25641 Stiffness of right hand, not elsewhere classified: Secondary | ICD-10-CM | POA: Diagnosis not present

## 2019-05-10 DIAGNOSIS — M25541 Pain in joints of right hand: Secondary | ICD-10-CM | POA: Diagnosis not present

## 2019-05-10 DIAGNOSIS — M25631 Stiffness of right wrist, not elsewhere classified: Secondary | ICD-10-CM | POA: Diagnosis not present

## 2019-05-10 DIAGNOSIS — M25531 Pain in right wrist: Secondary | ICD-10-CM | POA: Diagnosis not present

## 2019-05-12 DIAGNOSIS — M25631 Stiffness of right wrist, not elsewhere classified: Secondary | ICD-10-CM | POA: Diagnosis not present

## 2019-05-12 DIAGNOSIS — M25641 Stiffness of right hand, not elsewhere classified: Secondary | ICD-10-CM | POA: Diagnosis not present

## 2019-05-12 DIAGNOSIS — M25541 Pain in joints of right hand: Secondary | ICD-10-CM | POA: Diagnosis not present

## 2019-05-12 DIAGNOSIS — F4323 Adjustment disorder with mixed anxiety and depressed mood: Secondary | ICD-10-CM | POA: Diagnosis not present

## 2019-05-12 DIAGNOSIS — M25531 Pain in right wrist: Secondary | ICD-10-CM | POA: Diagnosis not present

## 2019-05-17 DIAGNOSIS — Z20828 Contact with and (suspected) exposure to other viral communicable diseases: Secondary | ICD-10-CM | POA: Diagnosis not present

## 2019-05-17 DIAGNOSIS — M25641 Stiffness of right hand, not elsewhere classified: Secondary | ICD-10-CM | POA: Diagnosis not present

## 2019-05-17 DIAGNOSIS — M25531 Pain in right wrist: Secondary | ICD-10-CM | POA: Diagnosis not present

## 2019-05-17 DIAGNOSIS — M25631 Stiffness of right wrist, not elsewhere classified: Secondary | ICD-10-CM | POA: Diagnosis not present

## 2019-05-17 DIAGNOSIS — M25541 Pain in joints of right hand: Secondary | ICD-10-CM | POA: Diagnosis not present

## 2019-05-19 DIAGNOSIS — M25531 Pain in right wrist: Secondary | ICD-10-CM | POA: Diagnosis not present

## 2019-05-19 DIAGNOSIS — M25641 Stiffness of right hand, not elsewhere classified: Secondary | ICD-10-CM | POA: Diagnosis not present

## 2019-05-19 DIAGNOSIS — M25631 Stiffness of right wrist, not elsewhere classified: Secondary | ICD-10-CM | POA: Diagnosis not present

## 2019-05-19 DIAGNOSIS — M25541 Pain in joints of right hand: Secondary | ICD-10-CM | POA: Diagnosis not present

## 2019-05-25 DIAGNOSIS — M25641 Stiffness of right hand, not elsewhere classified: Secondary | ICD-10-CM | POA: Diagnosis not present

## 2019-05-25 DIAGNOSIS — M25631 Stiffness of right wrist, not elsewhere classified: Secondary | ICD-10-CM | POA: Diagnosis not present

## 2019-05-25 DIAGNOSIS — M25541 Pain in joints of right hand: Secondary | ICD-10-CM | POA: Diagnosis not present

## 2019-05-25 DIAGNOSIS — M25531 Pain in right wrist: Secondary | ICD-10-CM | POA: Diagnosis not present

## 2019-05-26 DIAGNOSIS — M25531 Pain in right wrist: Secondary | ICD-10-CM | POA: Diagnosis not present

## 2019-05-26 DIAGNOSIS — M25631 Stiffness of right wrist, not elsewhere classified: Secondary | ICD-10-CM | POA: Diagnosis not present

## 2019-05-26 DIAGNOSIS — M25641 Stiffness of right hand, not elsewhere classified: Secondary | ICD-10-CM | POA: Diagnosis not present

## 2019-05-26 DIAGNOSIS — F4323 Adjustment disorder with mixed anxiety and depressed mood: Secondary | ICD-10-CM | POA: Diagnosis not present

## 2019-05-26 DIAGNOSIS — M25541 Pain in joints of right hand: Secondary | ICD-10-CM | POA: Diagnosis not present

## 2019-06-02 DIAGNOSIS — M25541 Pain in joints of right hand: Secondary | ICD-10-CM | POA: Diagnosis not present

## 2019-06-02 DIAGNOSIS — M25531 Pain in right wrist: Secondary | ICD-10-CM | POA: Diagnosis not present

## 2019-06-02 DIAGNOSIS — M25631 Stiffness of right wrist, not elsewhere classified: Secondary | ICD-10-CM | POA: Diagnosis not present

## 2019-06-02 DIAGNOSIS — M25641 Stiffness of right hand, not elsewhere classified: Secondary | ICD-10-CM | POA: Diagnosis not present

## 2019-06-07 DIAGNOSIS — M25531 Pain in right wrist: Secondary | ICD-10-CM | POA: Diagnosis not present

## 2019-06-07 DIAGNOSIS — M25631 Stiffness of right wrist, not elsewhere classified: Secondary | ICD-10-CM | POA: Diagnosis not present

## 2019-06-07 DIAGNOSIS — M25641 Stiffness of right hand, not elsewhere classified: Secondary | ICD-10-CM | POA: Diagnosis not present

## 2019-06-07 DIAGNOSIS — M25541 Pain in joints of right hand: Secondary | ICD-10-CM | POA: Diagnosis not present

## 2019-06-08 DIAGNOSIS — F4323 Adjustment disorder with mixed anxiety and depressed mood: Secondary | ICD-10-CM | POA: Diagnosis not present

## 2019-06-13 DIAGNOSIS — M25641 Stiffness of right hand, not elsewhere classified: Secondary | ICD-10-CM | POA: Diagnosis not present

## 2019-06-13 DIAGNOSIS — M25541 Pain in joints of right hand: Secondary | ICD-10-CM | POA: Diagnosis not present

## 2019-06-13 DIAGNOSIS — M25531 Pain in right wrist: Secondary | ICD-10-CM | POA: Diagnosis not present

## 2019-06-13 DIAGNOSIS — M25631 Stiffness of right wrist, not elsewhere classified: Secondary | ICD-10-CM | POA: Diagnosis not present

## 2019-06-23 DIAGNOSIS — F4323 Adjustment disorder with mixed anxiety and depressed mood: Secondary | ICD-10-CM | POA: Diagnosis not present

## 2019-06-24 DIAGNOSIS — Z5181 Encounter for therapeutic drug level monitoring: Secondary | ICD-10-CM | POA: Diagnosis not present

## 2019-06-24 DIAGNOSIS — E611 Iron deficiency: Secondary | ICD-10-CM | POA: Diagnosis not present

## 2019-06-24 DIAGNOSIS — Z0000 Encounter for general adult medical examination without abnormal findings: Secondary | ICD-10-CM | POA: Diagnosis not present

## 2019-06-24 DIAGNOSIS — E559 Vitamin D deficiency, unspecified: Secondary | ICD-10-CM | POA: Diagnosis not present

## 2019-06-24 DIAGNOSIS — Z23 Encounter for immunization: Secondary | ICD-10-CM | POA: Diagnosis not present

## 2019-06-24 DIAGNOSIS — E785 Hyperlipidemia, unspecified: Secondary | ICD-10-CM | POA: Diagnosis not present

## 2019-07-07 DIAGNOSIS — F4323 Adjustment disorder with mixed anxiety and depressed mood: Secondary | ICD-10-CM | POA: Diagnosis not present

## 2019-07-13 DIAGNOSIS — S62306A Unspecified fracture of fifth metacarpal bone, right hand, initial encounter for closed fracture: Secondary | ICD-10-CM | POA: Insufficient documentation

## 2019-07-13 DIAGNOSIS — M79641 Pain in right hand: Secondary | ICD-10-CM | POA: Diagnosis not present

## 2019-07-13 DIAGNOSIS — S62366A Nondisplaced fracture of neck of fifth metacarpal bone, right hand, initial encounter for closed fracture: Secondary | ICD-10-CM | POA: Diagnosis not present

## 2019-07-21 DIAGNOSIS — F4323 Adjustment disorder with mixed anxiety and depressed mood: Secondary | ICD-10-CM | POA: Diagnosis not present

## 2019-07-27 DIAGNOSIS — S62366D Nondisplaced fracture of neck of fifth metacarpal bone, right hand, subsequent encounter for fracture with routine healing: Secondary | ICD-10-CM | POA: Diagnosis not present

## 2019-07-27 DIAGNOSIS — M79641 Pain in right hand: Secondary | ICD-10-CM | POA: Diagnosis not present

## 2019-08-04 DIAGNOSIS — F4323 Adjustment disorder with mixed anxiety and depressed mood: Secondary | ICD-10-CM | POA: Diagnosis not present

## 2019-08-18 DIAGNOSIS — F4323 Adjustment disorder with mixed anxiety and depressed mood: Secondary | ICD-10-CM | POA: Diagnosis not present

## 2019-08-22 DIAGNOSIS — Z1231 Encounter for screening mammogram for malignant neoplasm of breast: Secondary | ICD-10-CM | POA: Diagnosis not present

## 2019-08-22 DIAGNOSIS — Z01419 Encounter for gynecological examination (general) (routine) without abnormal findings: Secondary | ICD-10-CM | POA: Diagnosis not present

## 2019-08-22 DIAGNOSIS — Z124 Encounter for screening for malignant neoplasm of cervix: Secondary | ICD-10-CM | POA: Diagnosis not present

## 2019-08-22 DIAGNOSIS — Z6821 Body mass index (BMI) 21.0-21.9, adult: Secondary | ICD-10-CM | POA: Diagnosis not present

## 2019-09-01 DIAGNOSIS — F4323 Adjustment disorder with mixed anxiety and depressed mood: Secondary | ICD-10-CM | POA: Diagnosis not present

## 2019-09-15 DIAGNOSIS — F4323 Adjustment disorder with mixed anxiety and depressed mood: Secondary | ICD-10-CM | POA: Diagnosis not present

## 2019-09-27 DIAGNOSIS — F4323 Adjustment disorder with mixed anxiety and depressed mood: Secondary | ICD-10-CM | POA: Diagnosis not present

## 2019-10-19 DIAGNOSIS — R0781 Pleurodynia: Secondary | ICD-10-CM | POA: Diagnosis not present

## 2019-10-19 DIAGNOSIS — S22060A Wedge compression fracture of T7-T8 vertebra, initial encounter for closed fracture: Secondary | ICD-10-CM | POA: Diagnosis not present

## 2019-10-19 DIAGNOSIS — M545 Low back pain: Secondary | ICD-10-CM | POA: Diagnosis not present

## 2019-10-20 DIAGNOSIS — F4323 Adjustment disorder with mixed anxiety and depressed mood: Secondary | ICD-10-CM | POA: Diagnosis not present

## 2019-10-27 DIAGNOSIS — F4323 Adjustment disorder with mixed anxiety and depressed mood: Secondary | ICD-10-CM | POA: Diagnosis not present

## 2019-11-09 DIAGNOSIS — F4323 Adjustment disorder with mixed anxiety and depressed mood: Secondary | ICD-10-CM | POA: Diagnosis not present

## 2019-11-24 DIAGNOSIS — F4323 Adjustment disorder with mixed anxiety and depressed mood: Secondary | ICD-10-CM | POA: Diagnosis not present

## 2019-11-25 DIAGNOSIS — M940 Chondrocostal junction syndrome [Tietze]: Secondary | ICD-10-CM | POA: Diagnosis not present

## 2019-11-30 ENCOUNTER — Other Ambulatory Visit: Payer: Self-pay | Admitting: Family Medicine

## 2019-11-30 DIAGNOSIS — E2839 Other primary ovarian failure: Secondary | ICD-10-CM

## 2019-12-07 ENCOUNTER — Telehealth: Payer: Self-pay | Admitting: Gastroenterology

## 2019-12-07 DIAGNOSIS — F4323 Adjustment disorder with mixed anxiety and depressed mood: Secondary | ICD-10-CM | POA: Diagnosis not present

## 2019-12-07 NOTE — Telephone Encounter (Signed)
Patient has not been seen in the office since 10/2017, patient scheduled for OV 3/9 at 2:30 pm.

## 2019-12-07 NOTE — Telephone Encounter (Signed)
Pt reported that she is experiencing a dull pain under her right rib cage.  She would like to discuss.

## 2019-12-18 ENCOUNTER — Ambulatory Visit: Payer: BC Managed Care – PPO | Attending: Internal Medicine

## 2019-12-18 DIAGNOSIS — Z23 Encounter for immunization: Secondary | ICD-10-CM | POA: Insufficient documentation

## 2019-12-18 NOTE — Progress Notes (Signed)
   Covid-19 Vaccination Clinic  Name:  Stephanie Giles    MRN: [id number] DOB: Jun 21, 1961  12/18/2019  [name] was observed post Covid-19 immunization for 15 minutes without incident. She was provided with Vaccine Information Sheet and instruction to access the V-Safe system.   [name] was instructed to call 911 with any severe reactions post vaccine: Marland Kitchen Difficulty breathing  . Swelling of face and throat  . A fast heartbeat  . A bad rash all over body  . Dizziness and weakness   Immunizations Administered    Name Date Dose VIS Date Route   Pfizer COVID-19 Vaccine 12/18/2019  1:11 PM 0.3 mL 09/23/2019 Intramuscular   Manufacturer: [place]   Lot: [id number]   [place]: [id number]

## 2019-12-20 ENCOUNTER — Encounter: Payer: Self-pay | Admitting: Gastroenterology

## 2019-12-20 ENCOUNTER — Ambulatory Visit (INDEPENDENT_AMBULATORY_CARE_PROVIDER_SITE_OTHER): Payer: BC Managed Care – PPO | Admitting: Gastroenterology

## 2019-12-20 VITALS — BP 110/72 | HR 74 | Temp 98.4°F | Ht 64.0 in | Wt 124.0 lb

## 2019-12-20 DIAGNOSIS — R1319 Other dysphagia: Secondary | ICD-10-CM

## 2019-12-20 DIAGNOSIS — R142 Eructation: Secondary | ICD-10-CM

## 2019-12-20 DIAGNOSIS — R1310 Dysphagia, unspecified: Secondary | ICD-10-CM | POA: Diagnosis not present

## 2019-12-20 DIAGNOSIS — R140 Abdominal distension (gaseous): Secondary | ICD-10-CM | POA: Diagnosis not present

## 2019-12-20 DIAGNOSIS — R1011 Right upper quadrant pain: Secondary | ICD-10-CM

## 2019-12-20 DIAGNOSIS — Z01818 Encounter for other preprocedural examination: Secondary | ICD-10-CM

## 2019-12-20 MED ORDER — HYOSCYAMINE SULFATE 0.125 MG SL SUBL: 0.1250 mg | SUBLINGUAL_TABLET | SUBLINGUAL | 1 refills | Status: DC | PRN

## 2019-12-20 NOTE — Patient Instructions (Signed)
If you are age 60 or older, your body mass index should be between 23-30. Your Body mass index is 21.28 kg/m. If this is out of the aforementioned range listed, please consider follow up with your Primary Care Provider.  If you are age 70 or younger, your body mass index should be between 19-25. Your Body mass index is 21.28 kg/m. If this is out of the aformentioned range listed, please consider follow up with your Primary Care Provider.   You have been scheduled for a CT scan of the abdomen and pelvis at [facility] ([address]---this is in the same building as [employer]).   You are scheduled on 12-27-2019 at 1130am. You should arrive 15 minutes prior to your appointment time for registration. Please follow the written instructions below on the day of your exam:  WARNING: IF YOU ARE ALLERGIC TO IODINE/X-RAY DYE, PLEASE NOTIFY RADIOLOGY IMMEDIATELY AT [phone]! YOU WILL BE GIVEN A 13 HOUR PREMEDICATION PREP.  1) Do not eat or drink anything after 730am (4 hours prior to your test) 2) You have been given 2 bottles of oral contrast to drink. The solution may taste better if refrigerated, but do NOT add ice or any other liquid to this solution. Shake well before drinking.    Drink 1 bottle of contrast @ 930am (2 hours prior to your exam)  Drink 1 bottle of contrast @ 1030am (1 hour prior to your exam)  You may take any medications as prescribed with a small amount of water, if necessary. If you take any of the following medications: METFORMIN, GLUCOPHAGE, GLUCOVANCE, AVANDAMET, RIOMET, FORTAMET, [place] MET, JANUMET, GLUMETZA or METAGLIP, you MAY be asked to HOLD this medication 48 hours AFTER the exam.  The purpose of you drinking the oral contrast is to aid in the visualization of your intestinal tract. The contrast solution may cause some diarrhea. Depending on your individual set of symptoms, you may also receive an intravenous injection of x-ray contrast/dye.  Plan on being at [facility] for 30 minutes or longer, depending on the type of exam you are having performed.  This test typically takes 30-45 minutes to complete.  If you have any questions regarding your exam or if you need to reschedule, you may call the CT department at [phone] between the hours of 8:00 am and 5:00 pm, Monday-Friday.  ________________________________________________________________________  Stephanie Giles have been scheduled for an endoscopy. Please follow written instructions given to you at your visit today. If you use inhalers (even only as needed), please bring them with you on the day of your procedure. Your physician has requested that you go to www.startemmi.com and enter the access code given to you at your visit today. This web site gives a general overview about your procedure. However, you should still follow specific instructions given to you by our office regarding your preparation for the procedure.  It was a pleasure to see you today!  Dr. Loletha Carrow

## 2019-12-20 NOTE — Progress Notes (Signed)
[facility] Progress Note  Chief Complaint: Right upper quadrant pain  Subjective  History: Clinic visit with me June 2017, longstanding IBS with intermittent diarrhea, belching and bloating.  Clinic visit November 2018 with flare symptoms after dietary trigger. Last colonoscopy with Dr. Olevia Perches 08/2011  Stephanie Giles has been bothered by right upper quadrant pain for the last several months.  It does not have a clear relation to position, time of day eating or bowel movements.  At times it will feel more swollen and hard.  No nausea or vomiting, however intermittent dysphagia where food briefly feels hung in the chest.  Denies odynophagia or heartburn.  As before, frequent belching and a feeling of gas pressure under the rib cage.  Levsin still works well when she takes intermittently for lower abdominal cramps or diarrhea.  PCP had recently prescribed dicyclomine, but just 1 dose made her want to "crawl out of my skin".  ROS: Cardiovascular:  no chest pain Respiratory: no dyspnea Residual hand pain after a fracture last year. Intermittent focal right-sided mid back pain with movement Remainder of systems negative except as above The patient's Past Medical, Family and Social History were reviewed and are on file in the EMR. Past Medical History:  Diagnosis Date  . Colon ulcer   . Diverticulitis   . Fractures    mid spine and also right hand and little finger, no surgery for either fx  . Irritable bowel   . Lactose intolerance   . Perforation of colon ([place])   . Perimenopausal   . Vertigo    Past Surgical History:  Procedure Laterality Date  . CESAREAN SECTION  1987  . COLONOSCOPY  09/01/2011   Procedure: COLONOSCOPY;  Surgeon: Lafayette Dragon, MD;  Location: [facility] ENDOSCOPY;  Service: Endoscopy;  Laterality: N/A;  . ESOPHAGOGASTRODUODENOSCOPY    . TONSILLECTOMY      Objective:  Med list reviewed  Current Outpatient Medications:  .  ALPRAZolam (XANAX) 0.25 MG tablet, Take  0.25 mg by mouth daily as needed. , Disp: , Rfl: 0 .  APPLE CIDER VINEGAR PO, Take by mouth daily as needed., Disp: , Rfl:  .  Cholecalciferol (VITAMIN D3) 125 MCG (5000 UT) CHEW, Chew by mouth daily., Disp: , Rfl:  .  hyoscyamine (LEVSIN SL) 0.125 MG SL tablet, Place 1 tablet (0.125 mg total) under the tongue every 4 (four) hours as needed for cramping., Disp: 60 tablet, Rfl: 1 .  loratadine (CLARITIN) 10 MG tablet, Take 10 mg by mouth daily.  , Disp: , Rfl:  .  Multiple Vitamins-Minerals (AIRBORNE GUMMIES PO), Take 1 tablet by mouth daily., Disp: , Rfl:  .  Multiple Vitamins-Minerals (MULTIVITAMINS THER. W/MINERALS) TABS, Take 1 tablet by mouth daily.  , Disp: , Rfl:  .  Simethicone (GAS-X PO), Take by mouth. Takes as needed, Disp: , Rfl:  .  valACYclovir (VALTREX) 1000 MG tablet, Take 1,000 mg by mouth daily. Takes as needed, Disp: , Rfl: 0 .  vitamin C (ASCORBIC ACID) 500 MG tablet, Take 500 mg by mouth daily.  , Disp: , Rfl:    Vital signs in last 24 hrs: Vitals:   12/20/19 1416  BP: 110/72  Pulse: 74  Temp: 98.4 F (36.9 C)    Physical Exam  Well-appearing, normal vocal quality  HEENT: sclera anicteric, oral mucosa moist without lesions  Neck: supple, no thyromegaly, JVD or lymphadenopathy  Cardiac: RRR without murmurs, S1S2 heard, no peripheral edema  Pulm: clear to auscultation bilaterally,  normal RR and effort noted  Abdomen: soft, RUQ and epigastric tenderness, with active bowel sounds. No guarding or palpable hepatosplenomegaly.  Skin; warm and dry, no jaundice or rash  Labs:   ___________________________________________ Radiologic studies:   ____________________________________________ Other:   _____________________________________________ Assessment & Plan  Assessment: Encounter Diagnoses  Name Primary?  . RUQ pain Yes  . Esophageal dysphagia   . Belching   . Abdominal bloating   . Preprocedural examination     Cause of symptoms unclear.   Atypical for ulcer or biliary colic.  Consider evolution of IBS/functional bowel disorder.  Plan: CT scan abdomen and pelvis EGD to evaluate right upper quadrant pain and dysphagia.  Agreeable after discussion of procedure and risks.  The benefits and risks of the planned procedure were described in detail with the patient or (when appropriate) their health care proxy.  Risks were outlined as including, but not limited to, bleeding, infection, perforation, adverse medication reaction leading to cardiac or pulmonary decompensation, pancreatitis (if ERCP).  The limitation of incomplete mucosal visualization was also discussed.  No guarantees or warranties were given.  30 minutes were spent on this encounter (including chart review, history/exam, counseling/coordination of care, and documentation)  Nelida Meuse III

## 2019-12-21 DIAGNOSIS — F4323 Adjustment disorder with mixed anxiety and depressed mood: Secondary | ICD-10-CM | POA: Diagnosis not present

## 2019-12-27 ENCOUNTER — Other Ambulatory Visit: Payer: BC Managed Care – PPO

## 2019-12-27 ENCOUNTER — Ambulatory Visit (INDEPENDENT_AMBULATORY_CARE_PROVIDER_SITE_OTHER)
Admission: RE | Admit: 2019-12-27 | Discharge: 2019-12-27 | Disposition: A | Discharge status: Home / Self Care | Payer: BC Managed Care – PPO | Source: Ambulatory Visit | Attending: Gastroenterology | Admitting: Gastroenterology

## 2019-12-27 ENCOUNTER — Other Ambulatory Visit: Payer: Self-pay

## 2019-12-27 DIAGNOSIS — R142 Eructation: Secondary | ICD-10-CM | POA: Diagnosis not present

## 2019-12-27 DIAGNOSIS — R1310 Dysphagia, unspecified: Secondary | ICD-10-CM

## 2019-12-27 DIAGNOSIS — R1319 Other dysphagia: Secondary | ICD-10-CM

## 2019-12-27 DIAGNOSIS — R1011 Right upper quadrant pain: Secondary | ICD-10-CM

## 2019-12-27 DIAGNOSIS — R140 Abdominal distension (gaseous): Secondary | ICD-10-CM

## 2019-12-27 MED ORDER — IOHEXOL 300 MG/ML  SOLN
100.0000 mL | Freq: Once | INTRAMUSCULAR | Status: AC | PRN
  Administered 2019-12-27: 100 mL via INTRAVENOUS

## 2019-12-29 ENCOUNTER — Telehealth: Payer: Self-pay

## 2019-12-29 ENCOUNTER — Encounter: Payer: BC Managed Care – PPO | Admitting: Gastroenterology

## 2019-12-29 NOTE — Telephone Encounter (Signed)
[employer] has denied the PA that was requested for hyoscyamine. Pt has picked up and paid out of pocket for the hyoscyamine.

## 2020-01-03 ENCOUNTER — Other Ambulatory Visit: Payer: Self-pay | Admitting: Gastroenterology

## 2020-01-03 ENCOUNTER — Ambulatory Visit (INDEPENDENT_AMBULATORY_CARE_PROVIDER_SITE_OTHER): Payer: BC Managed Care – PPO

## 2020-01-03 ENCOUNTER — Encounter: Payer: BC Managed Care – PPO | Admitting: Gastroenterology

## 2020-01-03 ENCOUNTER — Encounter: Payer: Self-pay | Admitting: Gastroenterology

## 2020-01-03 DIAGNOSIS — Z1159 Encounter for screening for other viral diseases: Secondary | ICD-10-CM

## 2020-01-03 LAB — SARS CORONAVIRUS 2 (TAT 6-24 HRS): SARS Coronavirus 2: NEGATIVE

## 2020-01-04 DIAGNOSIS — F4323 Adjustment disorder with mixed anxiety and depressed mood: Secondary | ICD-10-CM | POA: Diagnosis not present

## 2020-01-05 ENCOUNTER — Ambulatory Visit (AMBULATORY_SURGERY_CENTER): Payer: BC Managed Care – PPO | Admitting: Gastroenterology

## 2020-01-05 ENCOUNTER — Encounter: Payer: Self-pay | Admitting: Gastroenterology

## 2020-01-05 ENCOUNTER — Other Ambulatory Visit: Payer: Self-pay

## 2020-01-05 VITALS — BP 94/59 | HR 57 | Temp 98.2°F | Resp 15 | Ht 64.0 in | Wt 124.0 lb

## 2020-01-05 DIAGNOSIS — R1310 Dysphagia, unspecified: Secondary | ICD-10-CM

## 2020-01-05 DIAGNOSIS — R1011 Right upper quadrant pain: Secondary | ICD-10-CM

## 2020-01-05 DIAGNOSIS — K2950 Unspecified chronic gastritis without bleeding: Secondary | ICD-10-CM

## 2020-01-05 DIAGNOSIS — R140 Abdominal distension (gaseous): Secondary | ICD-10-CM

## 2020-01-05 MED ORDER — SODIUM CHLORIDE 0.9 % IV SOLN: 500.0000 mL | Freq: Once | INTRAVENOUS | Status: DC

## 2020-01-05 NOTE — Patient Instructions (Signed)
YOU HAD AN ENDOSCOPIC PROCEDURE TODAY AT THE [facility]:   Refer to the procedure report that was given to you for any specific questions about what was found during the examination.  If the procedure report does not answer your questions, please call your gastroenterologist to clarify.  If you requested that your care partner not be given the details of your procedure findings, then the procedure report has been included in a sealed envelope for you to review at your convenience later.  YOU SHOULD EXPECT: Some feelings of bloating in the abdomen. Passage of more gas than usual.  Walking can help get rid of the air that was put into your GI tract during the procedure and reduce the bloating. If you had a lower endoscopy (such as a colonoscopy or flexible sigmoidoscopy) you may notice spotting of blood in your stool or on the toilet paper. If you underwent a bowel prep for your procedure, you may not have a normal bowel movement for a few days.  Please Note:  You might notice some irritation and congestion in your nose or some drainage.  This is from the oxygen used during your procedure.  There is no need for concern and it should clear up in a day or so.  SYMPTOMS TO REPORT IMMEDIATELY:    Following upper endoscopy (EGD)  Vomiting of blood or coffee ground material  New chest pain or pain under the shoulder blades  Painful or persistently difficult swallowing  New shortness of breath  Fever of 100F or higher  Black, tarry-looking stools  For urgent or emergent issues, a gastroenterologist can be reached at any hour by calling [phone]. Do not use MyChart messaging for urgent concerns.    DIET:  We do recommend a small meal at first, but then you may proceed to your regular diet.  Drink plenty of fluids but you should avoid alcoholic beverages for 24 hours.  ACTIVITY:  You should plan to take it easy for the rest of today and you should NOT DRIVE or use heavy machinery  until tomorrow (because of the sedation medicines used during the test).    FOLLOW UP: Our staff will call the number listed on your records 48-72 hours following your procedure to check on you and address any questions or concerns that you may have regarding the information given to you following your procedure. If we do not reach you, we will leave a message.  We will attempt to reach you two times.  During this call, we will ask if you have developed any symptoms of COVID 19. If you develop any symptoms (ie: fever, flu-like symptoms, shortness of breath, cough etc.) before then, please call [phone].  If you test positive for Covid 19 in the 2 weeks post procedure, please call and report this information to us.    If any biopsies were taken you will be contacted by phone or by letter within the next 1-3 weeks.  Please call us at [phone] if you have not heard about the biopsies in 3 weeks.    SIGNATURES/CONFIDENTIALITY: You and/or your care partner have signed paperwork which will be entered into your electronic medical record.  These signatures attest to the fact that that the information above on your After Visit Summary has been reviewed and is understood.  Full responsibility of the confidentiality of this discharge information lies with you and/or your care-partner. 

## 2020-01-05 NOTE — Progress Notes (Signed)
Called to room to assist during endoscopic procedure.  Patient ID and intended procedure confirmed with present staff. Received instructions for my participation in the procedure from the performing physician.  

## 2020-01-05 NOTE — Op Note (Signed)
[facility] Patient Name: Stephanie Giles Procedure Date: 01/05/2020 8:48 AM MRN: [id number] Endoscopist: Mallie Mussel L. Loletha Carrow , MD Age: 59 Referring MD:  Date of Birth: Mar 27, 1961 Gender: Female Account #: [id number] Procedure:                Upper GI endoscopy Indications:              Abdominal pain in the right upper quadrant,                            Esophageal dysphagia, Abdominal bloating,                            Eructation (normal CTAP last week - see recent                            office note for symptom details) Medicines:                Monitored Anesthesia Care Procedure:                Pre-Anesthesia Assessment:                           - Prior to the procedure, a History and Physical                            was performed, and patient medications and                            allergies were reviewed. The patient's tolerance of                            previous anesthesia was also reviewed. The risks                            and benefits of the procedure and the sedation                            options and risks were discussed with the patient.                            All questions were answered, and informed consent                            was obtained. Prior Anticoagulants: The patient has                            taken no previous anticoagulant or antiplatelet                            agents. ASA Grade Assessment: II - A patient with                            mild systemic disease. After reviewing the risks  and benefits, the patient was deemed in                            satisfactory condition to undergo the procedure.                           After obtaining informed consent, the endoscope was                            passed under direct vision. Throughout the                            procedure, the patient's blood pressure, pulse, and                            oxygen saturations were monitored  continuously. The                            Endoscope was introduced through the mouth, and                            advanced to the third part of duodenum. The upper                            GI endoscopy was accomplished without difficulty.                            The patient tolerated the procedure well. Scope In: Scope Out: Findings:                 The larynx was normal.                           The esophagus was normal.                           Striped erythematous mucosa was found in the                            prepyloric region of the stomach. Biopsies were                            taken with a cold forceps for histology. ([place]                            protocol).                           The exam of the stomach was otherwise normal.                           The examined duodenum was normal. Complications:            No immediate complications. Estimated Blood Loss:     Estimated blood loss was minimal. Impression:               -  Normal larynx.                           - Normal esophagus.                           - Erythematous mucosa in the prepyloric region of                            the stomach. Biopsied.                           - Normal examined duodenum.                           Mild focal gastritis seen does not likely explain                            symptoms. Suspected non-specific UGI dysmotility. Recommendation:           - Patient has a contact number available for                            emergencies. The signs and symptoms of potential                            delayed complications were discussed with the                            patient. Return to normal activities tomorrow.                            Written discharge instructions were provided to the                            patient.                           - Resume previous diet.                           - Continue present medications.                           - Await  pathology results. If negative for H.                            pylori, consider trial of buspirone at bedtime. Justin Buechner L. Loletha Carrow, MD 01/05/2020 9:11:53 AM This report has been signed electronically.

## 2020-01-05 NOTE — Progress Notes (Signed)
pt tolerated well. VSS. awake and to recovery. Report given to RN.  

## 2020-01-06 ENCOUNTER — Telehealth: Payer: Self-pay | Admitting: Gastroenterology

## 2020-01-06 NOTE — Telephone Encounter (Signed)
Pt has had sneezing, runny nose and watery eyes since her procedure- she took children's Benadryl.  She does have allergies "all year round."  I told her the oxygen may have caused this, as well as perhaps her allergies.  She also c/o "a lump in her throat, but it hasn't stopped me from eating."  She was "hoarse yesterday but it's better today."  I told her to use salt water gargles if she has discomfort and to call back if she has any issues.  Dr. Loletha Carrow, Is there anything else you'd like me to do for her?  Thanks, [employer]

## 2020-01-06 NOTE — Telephone Encounter (Signed)
Pt had an EGD 01/05/20 and reported that afterwards she experienced sneezing, itchy eyes and a lump in her throat.  Her symptoms persist today.  Please advise.

## 2020-01-06 NOTE — Telephone Encounter (Signed)
Thanks for letting me know.  No other suggestions, I expect it will pass.

## 2020-01-09 ENCOUNTER — Telehealth: Payer: Self-pay | Admitting: *Deleted

## 2020-01-09 ENCOUNTER — Telehealth: Payer: Self-pay

## 2020-01-09 NOTE — Telephone Encounter (Signed)
  Follow up Call-  Call back number 01/05/2020  Post procedure Call Back phone  # [phone]  Permission to leave phone message Yes  Some recent data might be hidden     Patient questions:  Do you have a fever, pain , or abdominal swelling? No. Pain Score  0 *  Have you tolerated food without any problems? Yes.    Have you been able to return to your normal activities? Yes.    Do you have any questions about your discharge instructions: Diet   No. Medications  No. Follow up visit  No.  Do you have questions or concerns about your Care? No.  Actions: * If pain score is 4 or above: No action needed, pain <4.

## 2020-01-09 NOTE — Telephone Encounter (Signed)
Left message on f/u call 

## 2020-01-11 ENCOUNTER — Ambulatory Visit: Payer: BC Managed Care – PPO | Attending: Internal Medicine

## 2020-01-11 ENCOUNTER — Ambulatory Visit: Payer: BC Managed Care – PPO

## 2020-01-11 DIAGNOSIS — Z23 Encounter for immunization: Secondary | ICD-10-CM

## 2020-01-11 NOTE — Progress Notes (Signed)
   Covid-19 Vaccination Clinic  Name:  Stephanie Giles    MRN: [id number] DOB: 1961-04-16  01/11/2020  Ms. Stephanie Giles was observed post Covid-19 immunization for 15 minutes without incident. She was provided with Vaccine Information Sheet and instruction to access the V-Safe system.   [name] was instructed to call 911 with any severe reactions post vaccine: Marland Kitchen Difficulty breathing  . Swelling of face and throat  . A fast heartbeat  . A bad rash all over body  . Dizziness and weakness   Immunizations Administered    Name Date Dose VIS Date Route   Pfizer COVID-19 Vaccine 01/11/2020 12:40 PM 0.3 mL 09/23/2019 Intramuscular   Manufacturer: [place]   Lot: [id number]   [place]: [id number]

## 2020-01-13 DIAGNOSIS — F4323 Adjustment disorder with mixed anxiety and depressed mood: Secondary | ICD-10-CM | POA: Diagnosis not present

## 2020-01-16 ENCOUNTER — Other Ambulatory Visit: Payer: Self-pay

## 2020-01-16 MED ORDER — HYOSCYAMINE SULFATE 0.125 MG SL SUBL: 0.1250 mg | SUBLINGUAL_TABLET | SUBLINGUAL | 2 refills | Status: DC | PRN

## 2020-01-16 NOTE — Progress Notes (Signed)
Refill request for Hyoscyamine 0.125mg  q4h, prn. EGD on 01-05-20: Continue present medications. Script sent

## 2020-01-18 DIAGNOSIS — F4323 Adjustment disorder with mixed anxiety and depressed mood: Secondary | ICD-10-CM | POA: Diagnosis not present

## 2020-02-01 DIAGNOSIS — F4323 Adjustment disorder with mixed anxiety and depressed mood: Secondary | ICD-10-CM | POA: Diagnosis not present

## 2020-02-15 DIAGNOSIS — F4323 Adjustment disorder with mixed anxiety and depressed mood: Secondary | ICD-10-CM | POA: Diagnosis not present

## 2020-03-01 DIAGNOSIS — F4323 Adjustment disorder with mixed anxiety and depressed mood: Secondary | ICD-10-CM | POA: Diagnosis not present

## 2020-03-27 DIAGNOSIS — F4323 Adjustment disorder with mixed anxiety and depressed mood: Secondary | ICD-10-CM | POA: Diagnosis not present

## 2020-04-11 DIAGNOSIS — F4323 Adjustment disorder with mixed anxiety and depressed mood: Secondary | ICD-10-CM | POA: Diagnosis not present

## 2020-04-24 DIAGNOSIS — F4323 Adjustment disorder with mixed anxiety and depressed mood: Secondary | ICD-10-CM | POA: Diagnosis not present

## 2020-05-08 DIAGNOSIS — F4323 Adjustment disorder with mixed anxiety and depressed mood: Secondary | ICD-10-CM | POA: Diagnosis not present

## 2020-05-24 DIAGNOSIS — F4323 Adjustment disorder with mixed anxiety and depressed mood: Secondary | ICD-10-CM | POA: Diagnosis not present

## 2020-06-07 DIAGNOSIS — F4323 Adjustment disorder with mixed anxiety and depressed mood: Secondary | ICD-10-CM | POA: Diagnosis not present

## 2020-06-14 DIAGNOSIS — F4323 Adjustment disorder with mixed anxiety and depressed mood: Secondary | ICD-10-CM | POA: Diagnosis not present

## 2020-06-25 DIAGNOSIS — E559 Vitamin D deficiency, unspecified: Secondary | ICD-10-CM | POA: Diagnosis not present

## 2020-06-25 DIAGNOSIS — E785 Hyperlipidemia, unspecified: Secondary | ICD-10-CM | POA: Diagnosis not present

## 2020-06-25 DIAGNOSIS — E538 Deficiency of other specified B group vitamins: Secondary | ICD-10-CM | POA: Diagnosis not present

## 2020-06-25 DIAGNOSIS — Z23 Encounter for immunization: Secondary | ICD-10-CM | POA: Diagnosis not present

## 2020-06-25 DIAGNOSIS — R5382 Chronic fatigue, unspecified: Secondary | ICD-10-CM | POA: Diagnosis not present

## 2020-06-25 DIAGNOSIS — Z0000 Encounter for general adult medical examination without abnormal findings: Secondary | ICD-10-CM | POA: Diagnosis not present

## 2020-06-29 DIAGNOSIS — F4323 Adjustment disorder with mixed anxiety and depressed mood: Secondary | ICD-10-CM | POA: Diagnosis not present

## 2020-07-12 DIAGNOSIS — F4323 Adjustment disorder with mixed anxiety and depressed mood: Secondary | ICD-10-CM | POA: Diagnosis not present

## 2020-07-24 ENCOUNTER — Other Ambulatory Visit: Payer: BC Managed Care – PPO

## 2020-07-26 DIAGNOSIS — F4323 Adjustment disorder with mixed anxiety and depressed mood: Secondary | ICD-10-CM | POA: Diagnosis not present

## 2020-08-02 DIAGNOSIS — F4323 Adjustment disorder with mixed anxiety and depressed mood: Secondary | ICD-10-CM | POA: Diagnosis not present

## 2020-08-16 DIAGNOSIS — F4323 Adjustment disorder with mixed anxiety and depressed mood: Secondary | ICD-10-CM | POA: Diagnosis not present

## 2020-08-21 ENCOUNTER — Ambulatory Visit
Admission: RE | Admit: 2020-08-21 | Discharge: 2020-08-21 | Disposition: A | Discharge status: Home / Self Care | Payer: BC Managed Care – PPO | Source: Ambulatory Visit | Attending: Family Medicine | Admitting: Family Medicine

## 2020-08-21 ENCOUNTER — Other Ambulatory Visit: Payer: Self-pay

## 2020-08-21 DIAGNOSIS — M810 Age-related osteoporosis without current pathological fracture: Secondary | ICD-10-CM | POA: Diagnosis not present

## 2020-08-21 DIAGNOSIS — E2839 Other primary ovarian failure: Secondary | ICD-10-CM

## 2020-08-21 DIAGNOSIS — Z780 Asymptomatic menopausal state: Secondary | ICD-10-CM | POA: Diagnosis not present

## 2020-08-23 DIAGNOSIS — F4323 Adjustment disorder with mixed anxiety and depressed mood: Secondary | ICD-10-CM | POA: Diagnosis not present

## 2020-08-24 DIAGNOSIS — L814 Other melanin hyperpigmentation: Secondary | ICD-10-CM | POA: Diagnosis not present

## 2020-08-24 DIAGNOSIS — L2089 Other atopic dermatitis: Secondary | ICD-10-CM | POA: Diagnosis not present

## 2020-08-24 DIAGNOSIS — D2261 Melanocytic nevi of right upper limb, including shoulder: Secondary | ICD-10-CM | POA: Diagnosis not present

## 2020-08-24 DIAGNOSIS — D1801 Hemangioma of skin and subcutaneous tissue: Secondary | ICD-10-CM | POA: Diagnosis not present

## 2020-09-12 DIAGNOSIS — M810 Age-related osteoporosis without current pathological fracture: Secondary | ICD-10-CM | POA: Diagnosis not present

## 2020-09-13 DIAGNOSIS — F4323 Adjustment disorder with mixed anxiety and depressed mood: Secondary | ICD-10-CM | POA: Diagnosis not present

## 2020-09-17 DIAGNOSIS — Z01419 Encounter for gynecological examination (general) (routine) without abnormal findings: Secondary | ICD-10-CM | POA: Diagnosis not present

## 2020-09-17 DIAGNOSIS — Z1231 Encounter for screening mammogram for malignant neoplasm of breast: Secondary | ICD-10-CM | POA: Diagnosis not present

## 2020-09-17 DIAGNOSIS — N390 Urinary tract infection, site not specified: Secondary | ICD-10-CM | POA: Diagnosis not present

## 2020-09-17 DIAGNOSIS — Z6820 Body mass index (BMI) 20.0-20.9, adult: Secondary | ICD-10-CM | POA: Diagnosis not present

## 2020-09-21 DIAGNOSIS — R1312 Dysphagia, oropharyngeal phase: Secondary | ICD-10-CM | POA: Diagnosis not present

## 2020-09-21 DIAGNOSIS — K580 Irritable bowel syndrome with diarrhea: Secondary | ICD-10-CM | POA: Diagnosis not present

## 2020-09-21 DIAGNOSIS — M810 Age-related osteoporosis without current pathological fracture: Secondary | ICD-10-CM | POA: Diagnosis not present

## 2020-09-21 DIAGNOSIS — E739 Lactose intolerance, unspecified: Secondary | ICD-10-CM | POA: Diagnosis not present

## 2020-09-27 DIAGNOSIS — F4323 Adjustment disorder with mixed anxiety and depressed mood: Secondary | ICD-10-CM | POA: Diagnosis not present

## 2020-10-08 DIAGNOSIS — H2513 Age-related nuclear cataract, bilateral: Secondary | ICD-10-CM | POA: Diagnosis not present

## 2020-10-08 DIAGNOSIS — H35031 Hypertensive retinopathy, right eye: Secondary | ICD-10-CM | POA: Diagnosis not present

## 2020-10-08 DIAGNOSIS — H43391 Other vitreous opacities, right eye: Secondary | ICD-10-CM | POA: Diagnosis not present

## 2020-10-08 DIAGNOSIS — H0102A Squamous blepharitis right eye, upper and lower eyelids: Secondary | ICD-10-CM | POA: Diagnosis not present

## 2020-10-11 DIAGNOSIS — F4323 Adjustment disorder with mixed anxiety and depressed mood: Secondary | ICD-10-CM | POA: Diagnosis not present

## 2020-10-17 DIAGNOSIS — F4323 Adjustment disorder with mixed anxiety and depressed mood: Secondary | ICD-10-CM | POA: Diagnosis not present

## 2020-10-31 DIAGNOSIS — F4323 Adjustment disorder with mixed anxiety and depressed mood: Secondary | ICD-10-CM | POA: Diagnosis not present

## 2020-11-02 DIAGNOSIS — F4323 Adjustment disorder with mixed anxiety and depressed mood: Secondary | ICD-10-CM | POA: Diagnosis not present

## 2020-11-15 DIAGNOSIS — F4323 Adjustment disorder with mixed anxiety and depressed mood: Secondary | ICD-10-CM | POA: Diagnosis not present

## 2020-11-29 DIAGNOSIS — F4323 Adjustment disorder with mixed anxiety and depressed mood: Secondary | ICD-10-CM | POA: Diagnosis not present

## 2020-12-06 DIAGNOSIS — F4323 Adjustment disorder with mixed anxiety and depressed mood: Secondary | ICD-10-CM | POA: Diagnosis not present

## 2020-12-13 DIAGNOSIS — F4323 Adjustment disorder with mixed anxiety and depressed mood: Secondary | ICD-10-CM | POA: Diagnosis not present

## 2020-12-27 DIAGNOSIS — F4323 Adjustment disorder with mixed anxiety and depressed mood: Secondary | ICD-10-CM | POA: Diagnosis not present

## 2021-01-03 DIAGNOSIS — F4323 Adjustment disorder with mixed anxiety and depressed mood: Secondary | ICD-10-CM | POA: Diagnosis not present

## 2021-01-16 DIAGNOSIS — L738 Other specified follicular disorders: Secondary | ICD-10-CM | POA: Diagnosis not present

## 2021-01-16 DIAGNOSIS — L858 Other specified epidermal thickening: Secondary | ICD-10-CM | POA: Diagnosis not present

## 2021-01-16 DIAGNOSIS — L718 Other rosacea: Secondary | ICD-10-CM | POA: Diagnosis not present

## 2021-01-17 DIAGNOSIS — F4323 Adjustment disorder with mixed anxiety and depressed mood: Secondary | ICD-10-CM | POA: Diagnosis not present

## 2021-01-18 DIAGNOSIS — M7062 Trochanteric bursitis, left hip: Secondary | ICD-10-CM | POA: Diagnosis not present

## 2021-01-22 DIAGNOSIS — R300 Dysuria: Secondary | ICD-10-CM | POA: Diagnosis not present

## 2021-01-29 ENCOUNTER — Ambulatory Visit: Payer: BC Managed Care – PPO | Admitting: Physician Assistant

## 2021-01-30 DIAGNOSIS — F4323 Adjustment disorder with mixed anxiety and depressed mood: Secondary | ICD-10-CM | POA: Diagnosis not present

## 2021-02-04 ENCOUNTER — Ambulatory Visit: Payer: BC Managed Care – PPO | Admitting: Nurse Practitioner

## 2021-02-04 NOTE — Progress Notes (Deleted)
02/04/2021 Stephanie Giles [id number] 1961/09/06   Chief Complaint:  History of Present Illness: Stephanie Giles is a 60 year old female with a past medical history of GERD, diverticulitis and a perforated ascending colon due to perforated diverticulum vs a benign ulcer, likely from NSAID's. Past tonsillectomy and C section.   She presents to our office today for further evaluation regarding diarrhea predominant IBS   She was last seen in office by Dr. Loletha Carrow 12/20/2019 with RUQ pain. An abdominal/pelvic CT scan 12/28/2019 was negative, no evidence of diverticulitis/colitis or perforation. An EGD showed mild gastritis, otherwise was negative. Gastric biopsies confirmed mild chronic gastritis without evidence of H. Pylori. The duodenum appeared normal therefore biopsies were not obtained.    She has a history of a right colon perforations secondary to a benign ulcer in 2012. A colonoscopy 08/2018 showed a nonspecific ulcerated area likely NSAID-induced ulcer. No diverticulosis noted. Biopsies were nonspecific.  Colonoscopy  due Nov. 2022   Family History  Problem Relation Age of Onset  . Coronary artery disease Mother   . Ovarian cancer Mother   . Stroke Father   . Breast cancer Sister 20  . Ovarian cancer Sister   . Colon cancer Neg Hx   . Diabetes Neg Hx     EGD 01/05/2020: - Normal larynx. - Normal esophagus. - Erythematous mucosa in the prepyloric region of the stomach. Biopsied. - Normal examined duodenum. Mild focal gastritis seen does not likely explain symptoms. Suspected non-specific UGI Dysmotility. MILD CHRONIC GASTRITIS. - WARTHIN-STARRY IS NEGATIVE FOR HELICOBACTER PYLORI. - NO INTESTINAL METAPLASIA, DYSPLASIA, OR MALIGNANCY.  Colonoscopy 09/01/2011 by Dr. Olevia Perches:       Colon, biopsy, Right ascending - ULCERATED COLONIC MUCOSA WITH PROLAPSE TYPE INJURY, SEE COMMENT. - NEGATIVE FOR DYSPLASIA OR MALIGNANCY.   Past Medical History:  Diagnosis Date  .  Colon ulcer   . Diverticulitis   . Fractures    mid spine and also right hand and little finger, no surgery for either fx  . GERD (gastroesophageal reflux disease)   . Irritable bowel   . Lactose intolerance   . Perforation of colon ([place])   . Perimenopausal   . Vertigo        Current Medications, Allergies, Past Medical History, Past Surgical History, Family History and Social History were reviewed in [employer] record.   Review of Systems:   Constitutional: Negative for fever, sweats, chills or weight loss.  Respiratory: Negative for shortness of breath.   Cardiovascular: Negative for chest pain, palpitations and leg swelling.  Gastrointestinal: See HPI.  Musculoskeletal: Negative for back pain or muscle aches.  Neurological: Negative for dizziness, headaches or paresthesias.    Physical Exam: There were no vitals taken for this visit. General: Well developed, w   ***female in no acute distress. Head: Normocephalic and atraumatic. Eyes: No scleral icterus. Conjunctiva pink . Ears: Normal auditory acuity. Mouth: Dentition intact. No ulcers or lesions.  Lungs: Clear throughout to auscultation. Heart: Regular rate and rhythm, no murmur. Abdomen: Soft, nontender and nondistended. No masses or hepatomegaly. Normal bowel sounds x 4 quadrants.  Rectal: *** Musculoskeletal: Symmetrical with no gross deformities. Extremities: No edema. Neurological: Alert oriented x 4. No focal deficits.  Psychological: Alert and cooperative. Normal mood and affect  Assessment and Recommendations:  21. 60 year old female with diarrhea predominant IBS -TTG, IGA -Colonoscopy benefits and risks discussed including risk with sedation, risk of bleeding, perforation and infection   2.  Colon cancer screening. Normal colonoscopy 08/2011.  -See plan in # 1.

## 2021-02-05 DIAGNOSIS — F4323 Adjustment disorder with mixed anxiety and depressed mood: Secondary | ICD-10-CM | POA: Diagnosis not present

## 2021-02-19 DIAGNOSIS — F4323 Adjustment disorder with mixed anxiety and depressed mood: Secondary | ICD-10-CM | POA: Diagnosis not present

## 2021-02-26 DIAGNOSIS — F4323 Adjustment disorder with mixed anxiety and depressed mood: Secondary | ICD-10-CM | POA: Diagnosis not present

## 2021-03-12 DIAGNOSIS — F4323 Adjustment disorder with mixed anxiety and depressed mood: Secondary | ICD-10-CM | POA: Diagnosis not present

## 2021-03-19 ENCOUNTER — Encounter: Payer: Self-pay | Admitting: Gastroenterology

## 2021-03-19 ENCOUNTER — Ambulatory Visit: Payer: BC Managed Care – PPO | Admitting: Gastroenterology

## 2021-03-19 VITALS — BP 100/68 | HR 84 | Ht 63.0 in | Wt 116.2 lb

## 2021-03-19 DIAGNOSIS — R142 Eructation: Secondary | ICD-10-CM | POA: Diagnosis not present

## 2021-03-19 DIAGNOSIS — K580 Irritable bowel syndrome with diarrhea: Secondary | ICD-10-CM

## 2021-03-19 DIAGNOSIS — R140 Abdominal distension (gaseous): Secondary | ICD-10-CM | POA: Diagnosis not present

## 2021-03-19 NOTE — Patient Instructions (Signed)
If you are age 61 or older, your body mass index should be between 23-30. Your Body mass index is 20.59 kg/m. If this is out of the aforementioned range listed, please consider follow up with your Primary Care Provider.  If you are age 55 or younger, your body mass index should be between 19-25. Your Body mass index is 20.59 kg/m. If this is out of the aformentioned range listed, please consider follow up with your Primary Care Provider.   __________________________________________________________  The [facility] providers would like to encourage you to use [facility] to communicate with providers for non-urgent requests or questions.  Due to long hold times on the telephone, sending your provider a message by [facility] may be a faster and more efficient way to get a response.  Please allow 48 business hours for a response.  Please remember that this is for non-urgent requests.   It was a pleasure to see you today!  Thank you for trusting me with your gastrointestinal care!

## 2021-03-19 NOTE — Progress Notes (Signed)
[facility] Progress Note  Chief Complaint: IBS  Subjective  History: IBS with intermittent diarrhea, belching and bloating, last seen March 2021 for right upper quadrant pain.  CT scan and upper endoscopy unrevealing.  Biopsies negative for H. pylori. As needed Levsin helps sometimes with bloating and diarrhea, dicyclomine did not agree with her.  Last I saw her, we had discussed the possibility of trying buspirone for her functional bowel symptoms, and she wanted to give it further consideration.  Brittni came for reevaluation of her condition.  She feels that she is having more flares of either the crampy right-sided abdominal pain, belching or abdominal gurgling and noise.  She has come to know her condition well over many years and has learned her most common food triggers.  She eats a fairly restricted diet and even still has symptoms noted above.  She had some questions about endoscopy findings in CT report from last year. When she first made the appointment she had about 3 weeks of some intermittent lower abdominal cramps that finally resolved.  She still gets hot flashes for about the last 10 years but has not had postmenopausal bleeding  ROS: Cardiovascular:  no chest pain Respiratory: no dyspnea Mood stable  The patient's Past Medical, Family and Social History were reviewed and are on file in the EMR.  Objective:  Med list reviewed  Current Outpatient Medications:  .  ALPRAZolam (XANAX) 0.25 MG tablet, Take 0.25 mg by mouth daily as needed. , Disp: , Rfl: 0 .  APPLE CIDER VINEGAR PO, Take by mouth daily as needed., Disp: , Rfl:  .  Calcium 600-400 MG-UNIT CHEW, Chew 1 tablet by mouth as needed., Disp: , Rfl:  .  Cholecalciferol (VITAMIN D3) 125 MCG (5000 UT) CHEW, Chew by mouth daily., Disp: , Rfl:  .  clindamycin (CLEOCIN T) 1 % lotion, Apply 1 application topically daily., Disp: , Rfl:  .  hyoscyamine (LEVSIN SL) 0.125 MG SL tablet, Place 1 tablet (0.125 mg total)  under the tongue every 4 (four) hours as needed for cramping., Disp: 90 tablet, Rfl: 2 .  loratadine (CLARITIN) 10 MG tablet, Take 10 mg by mouth daily., Disp: , Rfl:  .  Multiple Vitamins-Minerals (AIRBORNE GUMMIES PO), Take 1 tablet by mouth daily., Disp: , Rfl:  .  Multiple Vitamins-Minerals (MULTIVITAMINS THER. W/MINERALS) TABS, Take 1 tablet by mouth daily., Disp: , Rfl:  .  Probiotic Product (ALIGN) CHEW, Chew 1 tablet by mouth daily., Disp: , Rfl:  .  Simethicone (GAS-X PO), Take by mouth. Takes as needed, Disp: , Rfl:  .  valACYclovir (VALTREX) 1000 MG tablet, Take 1,000 mg by mouth daily. Takes as needed, Disp: , Rfl: 0 .  vitamin C (ASCORBIC ACID) 500 MG tablet, Take 500 mg by mouth daily., Disp: , Rfl:  .  Meclizine HCl 25 MG CHEW, as needed. (Patient not taking: Reported on 03/19/2021), Disp: , Rfl:    Vital signs in last 24 hrs: Vitals:   03/19/21 1526  BP: 100/68  Pulse: 84   Wt Readings from Last 3 Encounters:  03/19/21 116 lb 4 oz (52.7 kg)  01/05/20 124 lb (56.2 kg)  12/20/19 124 lb (56.2 kg)    Physical Exam  Well-appearing  HEENT: sclera anicteric, oral mucosa moist without lesions  Neck: supple, no thyromegaly, JVD or lymphadenopathy  Cardiac: RRR without murmurs, S1S2 heard, no peripheral edema  Pulm: clear to auscultation bilaterally, normal RR and effort noted  Abdomen: soft, mild right  mid abdominal tenderness, with active bowel sounds. No guarding or palpable hepatosplenomegaly.  Skin; warm and dry, no jaundice or rash  Labs:   ___________________________________________ Radiologic studies:   ____________________________________________ Other:   _____________________________________________ Assessment & Plan  Assessment: Encounter Diagnoses  Name Primary?  . Irritable bowel syndrome with diarrhea Yes  . Abdominal bloating   . Belching     Chronic IBS-D with belching and bloating, extensive work-up over years. I think she is  experiencing some natural variability of this condition. Discussed the uncertain nature of this condition, limited available therapies.  I do not think there is further testing likely to be revealing for different diagnosis.  I offered a trial of low-dose buspirone at bedtime or IBGard, though she said she had tried peppermint oil in the past and it did not agree with her.  She is generally try to avoid prescription medicines if possible, but said she would consider the buspirone further.   20 minutes were spent on this encounter (including chart review, history/exam, counseling/coordination of care, and documentation) > 50% of that time was spent on counseling and coordination of care.  Topics discussed included: See above.  Nelida Meuse III

## 2021-03-26 DIAGNOSIS — F4323 Adjustment disorder with mixed anxiety and depressed mood: Secondary | ICD-10-CM | POA: Diagnosis not present

## 2021-04-09 DIAGNOSIS — F4323 Adjustment disorder with mixed anxiety and depressed mood: Secondary | ICD-10-CM | POA: Diagnosis not present

## 2021-04-11 DIAGNOSIS — F4323 Adjustment disorder with mixed anxiety and depressed mood: Secondary | ICD-10-CM | POA: Diagnosis not present

## 2021-04-18 DIAGNOSIS — F4323 Adjustment disorder with mixed anxiety and depressed mood: Secondary | ICD-10-CM | POA: Diagnosis not present

## 2021-05-14 DIAGNOSIS — F4323 Adjustment disorder with mixed anxiety and depressed mood: Secondary | ICD-10-CM | POA: Diagnosis not present

## 2021-05-21 DIAGNOSIS — F4323 Adjustment disorder with mixed anxiety and depressed mood: Secondary | ICD-10-CM | POA: Diagnosis not present

## 2021-06-06 DIAGNOSIS — F4323 Adjustment disorder with mixed anxiety and depressed mood: Secondary | ICD-10-CM | POA: Diagnosis not present

## 2021-06-07 DIAGNOSIS — F4323 Adjustment disorder with mixed anxiety and depressed mood: Secondary | ICD-10-CM | POA: Diagnosis not present

## 2021-06-24 DIAGNOSIS — F4323 Adjustment disorder with mixed anxiety and depressed mood: Secondary | ICD-10-CM | POA: Diagnosis not present

## 2021-06-25 DIAGNOSIS — F4323 Adjustment disorder with mixed anxiety and depressed mood: Secondary | ICD-10-CM | POA: Diagnosis not present

## 2021-07-02 DIAGNOSIS — F4323 Adjustment disorder with mixed anxiety and depressed mood: Secondary | ICD-10-CM | POA: Diagnosis not present

## 2021-07-03 DIAGNOSIS — F4323 Adjustment disorder with mixed anxiety and depressed mood: Secondary | ICD-10-CM | POA: Diagnosis not present

## 2021-07-05 DIAGNOSIS — M810 Age-related osteoporosis without current pathological fracture: Secondary | ICD-10-CM | POA: Diagnosis not present

## 2021-07-05 DIAGNOSIS — Z13220 Encounter for screening for lipoid disorders: Secondary | ICD-10-CM | POA: Diagnosis not present

## 2021-07-05 DIAGNOSIS — Z0000 Encounter for general adult medical examination without abnormal findings: Secondary | ICD-10-CM | POA: Diagnosis not present

## 2021-07-05 DIAGNOSIS — Z23 Encounter for immunization: Secondary | ICD-10-CM | POA: Diagnosis not present

## 2021-07-05 DIAGNOSIS — E559 Vitamin D deficiency, unspecified: Secondary | ICD-10-CM | POA: Diagnosis not present

## 2021-07-30 DIAGNOSIS — F4323 Adjustment disorder with mixed anxiety and depressed mood: Secondary | ICD-10-CM | POA: Diagnosis not present

## 2021-08-05 DIAGNOSIS — F4323 Adjustment disorder with mixed anxiety and depressed mood: Secondary | ICD-10-CM | POA: Diagnosis not present

## 2021-08-06 DIAGNOSIS — F4323 Adjustment disorder with mixed anxiety and depressed mood: Secondary | ICD-10-CM | POA: Diagnosis not present

## 2021-08-19 DIAGNOSIS — F4323 Adjustment disorder with mixed anxiety and depressed mood: Secondary | ICD-10-CM | POA: Diagnosis not present

## 2021-08-28 DIAGNOSIS — L820 Inflamed seborrheic keratosis: Secondary | ICD-10-CM | POA: Diagnosis not present

## 2021-08-28 DIAGNOSIS — B078 Other viral warts: Secondary | ICD-10-CM | POA: Diagnosis not present

## 2021-08-28 DIAGNOSIS — L918 Other hypertrophic disorders of the skin: Secondary | ICD-10-CM | POA: Diagnosis not present

## 2021-09-02 DIAGNOSIS — F4323 Adjustment disorder with mixed anxiety and depressed mood: Secondary | ICD-10-CM | POA: Diagnosis not present

## 2021-09-18 DIAGNOSIS — F4323 Adjustment disorder with mixed anxiety and depressed mood: Secondary | ICD-10-CM | POA: Diagnosis not present

## 2021-10-01 DIAGNOSIS — F4323 Adjustment disorder with mixed anxiety and depressed mood: Secondary | ICD-10-CM | POA: Diagnosis not present

## 2021-10-09 ENCOUNTER — Encounter: Payer: Self-pay | Admitting: Gastroenterology

## 2021-10-15 DIAGNOSIS — Z1231 Encounter for screening mammogram for malignant neoplasm of breast: Secondary | ICD-10-CM | POA: Diagnosis not present

## 2021-10-15 DIAGNOSIS — Z6820 Body mass index (BMI) 20.0-20.9, adult: Secondary | ICD-10-CM | POA: Diagnosis not present

## 2021-10-15 DIAGNOSIS — Z124 Encounter for screening for malignant neoplasm of cervix: Secondary | ICD-10-CM | POA: Diagnosis not present

## 2021-10-15 DIAGNOSIS — Z01419 Encounter for gynecological examination (general) (routine) without abnormal findings: Secondary | ICD-10-CM | POA: Diagnosis not present

## 2021-10-17 DIAGNOSIS — H25813 Combined forms of age-related cataract, bilateral: Secondary | ICD-10-CM | POA: Diagnosis not present

## 2021-10-17 DIAGNOSIS — H43391 Other vitreous opacities, right eye: Secondary | ICD-10-CM | POA: Diagnosis not present

## 2021-10-17 DIAGNOSIS — H0102A Squamous blepharitis right eye, upper and lower eyelids: Secondary | ICD-10-CM | POA: Diagnosis not present

## 2021-10-17 DIAGNOSIS — H04123 Dry eye syndrome of bilateral lacrimal glands: Secondary | ICD-10-CM | POA: Diagnosis not present

## 2021-10-22 DIAGNOSIS — F4323 Adjustment disorder with mixed anxiety and depressed mood: Secondary | ICD-10-CM | POA: Diagnosis not present

## 2021-10-29 DIAGNOSIS — F4323 Adjustment disorder with mixed anxiety and depressed mood: Secondary | ICD-10-CM | POA: Diagnosis not present

## 2021-10-30 DIAGNOSIS — L821 Other seborrheic keratosis: Secondary | ICD-10-CM | POA: Diagnosis not present

## 2021-10-30 DIAGNOSIS — D225 Melanocytic nevi of trunk: Secondary | ICD-10-CM | POA: Diagnosis not present

## 2021-10-30 DIAGNOSIS — D2272 Melanocytic nevi of left lower limb, including hip: Secondary | ICD-10-CM | POA: Diagnosis not present

## 2021-10-30 DIAGNOSIS — D1801 Hemangioma of skin and subcutaneous tissue: Secondary | ICD-10-CM | POA: Diagnosis not present

## 2021-12-11 DIAGNOSIS — F4323 Adjustment disorder with mixed anxiety and depressed mood: Secondary | ICD-10-CM | POA: Diagnosis not present

## 2022-01-14 DIAGNOSIS — F4323 Adjustment disorder with mixed anxiety and depressed mood: Secondary | ICD-10-CM | POA: Diagnosis not present

## 2022-01-21 DIAGNOSIS — F4323 Adjustment disorder with mixed anxiety and depressed mood: Secondary | ICD-10-CM | POA: Diagnosis not present

## 2022-02-06 DIAGNOSIS — F4323 Adjustment disorder with mixed anxiety and depressed mood: Secondary | ICD-10-CM | POA: Diagnosis not present

## 2022-02-07 DIAGNOSIS — F4323 Adjustment disorder with mixed anxiety and depressed mood: Secondary | ICD-10-CM | POA: Diagnosis not present

## 2022-02-26 DIAGNOSIS — F4323 Adjustment disorder with mixed anxiety and depressed mood: Secondary | ICD-10-CM | POA: Diagnosis not present

## 2022-03-11 DIAGNOSIS — J0100 Acute maxillary sinusitis, unspecified: Secondary | ICD-10-CM | POA: Diagnosis not present

## 2022-03-14 DIAGNOSIS — F4323 Adjustment disorder with mixed anxiety and depressed mood: Secondary | ICD-10-CM | POA: Diagnosis not present

## 2022-03-17 DIAGNOSIS — K580 Irritable bowel syndrome with diarrhea: Secondary | ICD-10-CM | POA: Diagnosis not present

## 2022-03-17 DIAGNOSIS — E739 Lactose intolerance, unspecified: Secondary | ICD-10-CM | POA: Diagnosis not present

## 2022-03-17 DIAGNOSIS — M810 Age-related osteoporosis without current pathological fracture: Secondary | ICD-10-CM | POA: Diagnosis not present

## 2022-03-17 DIAGNOSIS — R1312 Dysphagia, oropharyngeal phase: Secondary | ICD-10-CM | POA: Diagnosis not present

## 2022-03-28 DIAGNOSIS — F4323 Adjustment disorder with mixed anxiety and depressed mood: Secondary | ICD-10-CM | POA: Diagnosis not present

## 2022-04-09 ENCOUNTER — Other Ambulatory Visit: Payer: Self-pay | Admitting: Rheumatology

## 2022-04-09 DIAGNOSIS — F4323 Adjustment disorder with mixed anxiety and depressed mood: Secondary | ICD-10-CM | POA: Diagnosis not present

## 2022-04-09 DIAGNOSIS — M810 Age-related osteoporosis without current pathological fracture: Secondary | ICD-10-CM

## 2022-05-14 ENCOUNTER — Ambulatory Visit
Admission: RE | Admit: 2022-05-14 | Discharge: 2022-05-14 | Disposition: A | Discharge status: Home / Self Care | Payer: BC Managed Care – PPO | Source: Ambulatory Visit | Attending: Family Medicine | Admitting: Family Medicine

## 2022-05-14 ENCOUNTER — Other Ambulatory Visit: Payer: Self-pay | Admitting: Family Medicine

## 2022-05-14 DIAGNOSIS — R509 Fever, unspecified: Secondary | ICD-10-CM | POA: Diagnosis not present

## 2022-05-14 DIAGNOSIS — R052 Subacute cough: Secondary | ICD-10-CM | POA: Diagnosis not present

## 2022-05-14 DIAGNOSIS — R059 Cough, unspecified: Secondary | ICD-10-CM | POA: Diagnosis not present

## 2022-05-14 DIAGNOSIS — R058 Other specified cough: Secondary | ICD-10-CM

## 2022-05-15 DIAGNOSIS — F4323 Adjustment disorder with mixed anxiety and depressed mood: Secondary | ICD-10-CM | POA: Diagnosis not present

## 2022-05-22 DIAGNOSIS — E559 Vitamin D deficiency, unspecified: Secondary | ICD-10-CM | POA: Insufficient documentation

## 2022-05-22 DIAGNOSIS — G9332 Myalgic encephalomyelitis/chronic fatigue syndrome: Secondary | ICD-10-CM | POA: Insufficient documentation

## 2022-05-22 DIAGNOSIS — M810 Age-related osteoporosis without current pathological fracture: Secondary | ICD-10-CM | POA: Insufficient documentation

## 2022-05-22 DIAGNOSIS — H35039 Hypertensive retinopathy, unspecified eye: Secondary | ICD-10-CM | POA: Insufficient documentation

## 2022-05-22 DIAGNOSIS — M1990 Unspecified osteoarthritis, unspecified site: Secondary | ICD-10-CM | POA: Insufficient documentation

## 2022-05-22 DIAGNOSIS — E785 Hyperlipidemia, unspecified: Secondary | ICD-10-CM | POA: Insufficient documentation

## 2022-05-27 ENCOUNTER — Ambulatory Visit (AMBULATORY_SURGERY_CENTER): Payer: BLUE CROSS/BLUE SHIELD | Admitting: *Deleted

## 2022-05-27 VITALS — Ht 63.0 in | Wt 118.8 lb

## 2022-05-27 DIAGNOSIS — Z1211 Encounter for screening for malignant neoplasm of colon: Secondary | ICD-10-CM

## 2022-05-27 MED ORDER — NA SULFATE-K SULFATE-MG SULF 17.5-3.13-1.6 GM/177ML PO SOLN: 1.0000 | Freq: Once | ORAL | 0 refills | Status: AC

## 2022-05-27 NOTE — Progress Notes (Signed)
No egg or soy allergy known to patient  No issues known to pt with past sedation with any surgeries or procedures Patient denies ever being told they had issues or difficulty with intubation  No FH of Malignant Hyperthermia Pt is not on diet pills Pt is not on  home 02  Pt is not on blood thinners  Pt denies issues with constipation  No A fib or A flutter Have any cardiac testing pending--NO Pt instructed to use Singlecare.com or GoodRx for a price reduction on prep    Pt.had covid one month ago ,has a cough and low grade fever only at night,went into see pcp had chest x ray and blood work and everything came back clear.

## 2022-05-28 DIAGNOSIS — F4323 Adjustment disorder with mixed anxiety and depressed mood: Secondary | ICD-10-CM | POA: Diagnosis not present

## 2022-06-19 ENCOUNTER — Encounter: Payer: Self-pay | Admitting: Gastroenterology

## 2022-06-26 DIAGNOSIS — F4323 Adjustment disorder with mixed anxiety and depressed mood: Secondary | ICD-10-CM | POA: Diagnosis not present

## 2022-06-29 ENCOUNTER — Encounter: Payer: Self-pay | Admitting: Certified Registered Nurse Anesthetist

## 2022-07-01 ENCOUNTER — Encounter: Payer: BLUE CROSS/BLUE SHIELD | Admitting: Gastroenterology

## 2022-07-01 ENCOUNTER — Ambulatory Visit (AMBULATORY_SURGERY_CENTER): Payer: BC Managed Care – PPO | Admitting: Gastroenterology

## 2022-07-01 ENCOUNTER — Other Ambulatory Visit: Payer: Self-pay | Admitting: Gastroenterology

## 2022-07-01 ENCOUNTER — Encounter: Payer: Self-pay | Admitting: Gastroenterology

## 2022-07-01 VITALS — BP 95/74 | HR 57 | Temp 96.9°F | Resp 16 | Ht 63.0 in | Wt 118.8 lb

## 2022-07-01 DIAGNOSIS — Z1211 Encounter for screening for malignant neoplasm of colon: Secondary | ICD-10-CM

## 2022-07-01 DIAGNOSIS — K580 Irritable bowel syndrome with diarrhea: Secondary | ICD-10-CM

## 2022-07-01 MED ORDER — HYOSCYAMINE SULFATE 0.125 MG SL SUBL: 0.1250 mg | SUBLINGUAL_TABLET | Freq: Four times a day (QID) | SUBLINGUAL | 1 refills | Status: DC | PRN

## 2022-07-01 MED ORDER — SODIUM CHLORIDE 0.9 % IV SOLN: 500.0000 mL | Freq: Once | INTRAVENOUS | Status: DC

## 2022-07-01 NOTE — Progress Notes (Signed)
Report given to PACU, vss 

## 2022-07-01 NOTE — Progress Notes (Signed)
Pt's states no medical or surgical changes since previsit or office visit. 

## 2022-07-01 NOTE — Patient Instructions (Signed)
Thank you for coming in to see Stephanie Giles today! Resume your diet and medications/supplements today. Return to your regular daily activities tomorrow. Recommend next screening colonoscopy in 10 years.  YOU HAD AN ENDOSCOPIC PROCEDURE TODAY AT [facility]:   Refer to the procedure report that was given to you for any specific questions about what was found during the examination.  If the procedure report does not answer your questions, please call your gastroenterologist to clarify.  If you requested that your care partner not be given the details of your procedure findings, then the procedure report has been included in a sealed envelope for you to review at your convenience later.  YOU SHOULD EXPECT: Some feelings of bloating in the abdomen. Passage of more gas than usual.  Walking can help get rid of the air that was put into your GI tract during the procedure and reduce the bloating. If you had a lower endoscopy (such as a colonoscopy or flexible sigmoidoscopy) you may notice spotting of blood in your stool or on the toilet paper. If you underwent a bowel prep for your procedure, you may not have a normal bowel movement for a few days.  Please Note:  You might notice some irritation and congestion in your nose or some drainage.  This is from the oxygen used during your procedure.  There is no need for concern and it should clear up in a day or so.  SYMPTOMS TO REPORT IMMEDIATELY:  Following lower endoscopy (colonoscopy or flexible sigmoidoscopy):  Excessive amounts of blood in the stool  Significant tenderness or worsening of abdominal pains  Swelling of the abdomen that is new, acute  Fever of 100F or higher    For urgent or emergent issues, a gastroenterologist can be reached at any hour by calling [phone]. Do not use MyChart messaging for urgent concerns.    DIET:  We do recommend a small meal at first, but then you may proceed to your regular diet.  Drink plenty of  fluids but you should avoid alcoholic beverages for 24 hours.  ACTIVITY:  You should plan to take it easy for the rest of today and you should NOT DRIVE or use heavy machinery until tomorrow (because of the sedation medicines used during the test).    FOLLOW UP: Our staff will call the number listed on your records the next business day following your procedure.  We will call around 7:15- 8:00 am to check on you and address any questions or concerns that you may have regarding the information given to you following your procedure. If we do not reach you, we will leave a message.     If any biopsies were taken you will be contacted by phone or by letter within the next 1-3 weeks.  Please call Stephanie Giles at [phone] if you have not heard about the biopsies in 3 weeks.    SIGNATURES/CONFIDENTIALITY: You and/or your care partner have signed paperwork which will be entered into your electronic medical record.  These signatures attest to the fact that that the information above on your After Visit Summary has been reviewed and is understood.  Full responsibility of the confidentiality of this discharge information lies with you and/or your care-partner.

## 2022-07-01 NOTE — Op Note (Signed)
[facility] Patient Name: Stephanie Giles Procedure Date: 07/01/2022 2:17 PM MRN: [id number] Endoscopist: Mallie Mussel L. Loletha Carrow , MD Age: 61 Referring MD:  Date of Birth: 04/13/1961 Gender: Female Account #: [id number] Procedure:                Colonoscopy Indications:              Screening for colorectal malignant neoplasm                           No polyps last colonoscopy Nov 2012 Medicines:                Monitored Anesthesia Care Procedure:                Pre-Anesthesia Assessment:                           - Prior to the procedure, a History and Physical                            was performed, and patient medications and                            allergies were reviewed. The patient's tolerance of                            previous anesthesia was also reviewed. The risks                            and benefits of the procedure and the sedation                            options and risks were discussed with the patient.                            All questions were answered, and informed consent                            was obtained. Prior Anticoagulants: The patient has                            taken no previous anticoagulant or antiplatelet                            agents. ASA Grade Assessment: II - A patient with                            mild systemic disease. After reviewing the risks                            and benefits, the patient was deemed in                            satisfactory condition to undergo the procedure.  After obtaining informed consent, the colonoscope                            was passed under direct vision. Throughout the                            procedure, the patient's blood pressure, pulse, and                            oxygen saturations were monitored continuously. The                            Olympus PCF-H190DL (#[id number]) Colonoscope was                            introduced through the anus and  advanced to the the                            cecum, identified by appendiceal orifice and                            ileocecal valve. The colonoscopy was performed                            without difficulty. The patient tolerated the                            procedure well. The quality of the bowel                            preparation was good. The ileocecal valve,                            appendiceal orifice, and rectum were photographed.                            The bowel preparation used was SUPREP. Scope In: 2:52:58 PM Scope Out: 3:07:40 PM Scope Withdrawal Time: 0 hours 10 minutes 33 seconds  Total Procedure Duration: 0 hours 14 minutes 42 seconds  Findings:                 The perianal and digital rectal examinations were                            normal.                           The entire examined colon appeared normal on direct                            and retroflexion views. Complications:            No immediate complications. Estimated Blood Loss:     Estimated blood loss: none. Impression:               - The entire examined colon is normal  on direct and                            retroflexion views.                           - No specimens collected. Recommendation:           - Patient has a contact number available for                            emergencies. The signs and symptoms of potential                            delayed complications were discussed with the                            patient. Return to normal activities tomorrow.                            Written discharge instructions were provided to the                            patient.                           - Resume previous diet.                           - Continue present medications.                           - Repeat colonoscopy in 10 years for screening                            purposes. Lincon Sahlin L. Loletha Carrow, MD 07/01/2022 3:13:39 PM This report has been signed electronically.

## 2022-07-01 NOTE — Progress Notes (Signed)
History and Physical:  This patient presents for endoscopic testing for: Encounter Diagnosis  Name Primary?   Special screening for malignant neoplasms, colon Yes    Average risk for colorectal cancer.  No polyps on last colonoscopy November 2012. I have also previously seen her for IBS with diarrhea.  Patient is otherwise without complaints or active issues today.   Past Medical History: Past Medical History:  Diagnosis Date   Allergy    SEASONAL   Colon ulcer    Diverticulitis    Fractures    mid spine and also right hand and little finger, no surgery for either fx   GERD (gastroesophageal reflux disease)    Irritable bowel    Lactose intolerance    Osteoporosis    Perforation of colon (HCC)    Perimenopausal    Vertigo      Past Surgical History: Past Surgical History:  Procedure Laterality Date   BROKEN HAND Right    CAST   CESAREAN SECTION  10/13/1985   COLONOSCOPY  09/01/2011   Procedure: COLONOSCOPY;  Surgeon: Lafayette Dragon, MD;  Location: [facility];  Service: Endoscopy;  Laterality: N/A;   COLONOSCOPY     ESOPHAGOGASTRODUODENOSCOPY     TONSILLECTOMY      Allergies: Allergies  Allergen Reactions   Neosporin [Bacitracin-Polymyxin B] Itching   Azithromycin Other (See Comments)   Clarithromycin Nausea Only and Other (See Comments)   Sulfa Antibiotics Nausea Only   Prednisone Palpitations    Outpatient Meds: Current Outpatient Medications  Medication Sig Dispense Refill   ALPRAZolam (XANAX) 0.25 MG tablet Take 0.25 mg by mouth daily as needed.   0   Cholecalciferol (VITAMIN D3) 125 MCG (5000 UT) CHEW Chew by mouth daily.     hyoscyamine (LEVSIN SL) 0.125 MG SL tablet Place 1 tablet (0.125 mg total) under the tongue every 4 (four) hours as needed for cramping. 90 tablet 2   loratadine (CLARITIN) 10 MG tablet Take 10 mg by mouth daily.     Multiple Vitamins-Minerals (AIRBORNE GUMMIES PO) Take 1 tablet by mouth daily.     Multiple Vitamins-Minerals  (MULTIVITAMINS THER. W/MINERALS) TABS Take 1 tablet by mouth daily.     OVER THE COUNTER MEDICATION SLEEP CHOCOLATE     OVER THE COUNTER MEDICATION as needed. RESTFUL LEGS     Probiotic Product (ALIGN) CHEW Chew 1 tablet by mouth daily.     Simethicone (GAS-X PO) Take by mouth. Takes as needed     Calcium 600-400 MG-UNIT CHEW Chew 1 tablet by mouth as needed. (Patient not taking: Reported on 05/27/2022)     CALCIUM PO Take by mouth. EZORB POWDER     clindamycin (CLEOCIN T) 1 % lotion Apply 1 application topically daily. (Patient not taking: Reported on 05/27/2022)     Ferrous Sulfate (IRON PO) Take by mouth. CHEWABLE TAKE ONE DAILY     Ibuprofen (MOTRIN CHILDRENS PO) Take by mouth. SWITCH BETWEEN CHEW AND LIQUID     Meclizine HCl 25 MG CHEW as needed. (Patient not taking: Reported on 03/19/2021)     valACYclovir (VALTREX) 1000 MG tablet Take 1,000 mg by mouth daily. Takes as needed  0   vitamin C (ASCORBIC ACID) 500 MG tablet Take 500 mg by mouth daily.     Current Facility-Administered Medications  Medication Dose Route Frequency Provider Last Rate Last Admin   0.9 %  sodium chloride infusion  500 mL Intravenous Once Doran Stabler, MD  ___________________________________________________________________ Objective   Exam:  BP 107/65   Pulse 71   Temp (!) 96.9 F (36.1 C)   Ht '5\' 3"'$  (1.6 m)   Wt 118 lb 12.8 oz (53.9 kg)   SpO2 99%   BMI 21.04 kg/m   CV: RRR without murmur, S1/S2 Resp: clear to auscultation bilaterally, normal RR and effort noted GI: soft, no tenderness, with active bowel sounds.   Assessment: Encounter Diagnosis  Name Primary?   Special screening for malignant neoplasms, colon Yes     Plan: Colonoscopy  The benefits and risks of the planned procedure were described in detail with the patient or (when appropriate) their health care proxy.  Risks were outlined as including, but not limited to, bleeding, infection, perforation, adverse medication  reaction leading to cardiac or pulmonary decompensation, pancreatitis (if ERCP).  The limitation of incomplete mucosal visualization was also discussed.  No guarantees or warranties were given.    The patient is appropriate for an endoscopic procedure in the ambulatory setting.   - Wilfrid Lund, MD

## 2022-07-02 ENCOUNTER — Telehealth: Payer: Self-pay

## 2022-07-02 NOTE — Telephone Encounter (Signed)
  Follow up Call-     07/01/2022    1:52 PM 01/05/2020    7:52 AM  Call back number  Post procedure Call Back phone  # [phone]  Permission to leave phone message Yes Yes     Post op call attempted, no answer, left WM.

## 2022-07-16 DIAGNOSIS — E785 Hyperlipidemia, unspecified: Secondary | ICD-10-CM | POA: Diagnosis not present

## 2022-07-16 DIAGNOSIS — Z0000 Encounter for general adult medical examination without abnormal findings: Secondary | ICD-10-CM | POA: Diagnosis not present

## 2022-07-16 DIAGNOSIS — Z5181 Encounter for therapeutic drug level monitoring: Secondary | ICD-10-CM | POA: Diagnosis not present

## 2022-07-16 DIAGNOSIS — F419 Anxiety disorder, unspecified: Secondary | ICD-10-CM | POA: Diagnosis not present

## 2022-07-16 DIAGNOSIS — D696 Thrombocytopenia, unspecified: Secondary | ICD-10-CM | POA: Diagnosis not present

## 2022-07-16 DIAGNOSIS — Z23 Encounter for immunization: Secondary | ICD-10-CM | POA: Diagnosis not present

## 2022-07-16 DIAGNOSIS — M79622 Pain in left upper arm: Secondary | ICD-10-CM | POA: Diagnosis not present

## 2022-07-16 DIAGNOSIS — E559 Vitamin D deficiency, unspecified: Secondary | ICD-10-CM | POA: Diagnosis not present

## 2022-07-17 DIAGNOSIS — F4323 Adjustment disorder with mixed anxiety and depressed mood: Secondary | ICD-10-CM | POA: Diagnosis not present

## 2022-07-28 ENCOUNTER — Other Ambulatory Visit: Payer: Self-pay | Admitting: Gastroenterology

## 2022-10-21 DIAGNOSIS — H35033 Hypertensive retinopathy, bilateral: Secondary | ICD-10-CM | POA: Diagnosis not present

## 2022-10-21 DIAGNOSIS — H25813 Combined forms of age-related cataract, bilateral: Secondary | ICD-10-CM | POA: Diagnosis not present

## 2022-10-21 DIAGNOSIS — H524 Presbyopia: Secondary | ICD-10-CM | POA: Diagnosis not present

## 2022-10-21 DIAGNOSIS — H04123 Dry eye syndrome of bilateral lacrimal glands: Secondary | ICD-10-CM | POA: Diagnosis not present

## 2022-10-27 DIAGNOSIS — Z01419 Encounter for gynecological examination (general) (routine) without abnormal findings: Secondary | ICD-10-CM | POA: Diagnosis not present

## 2022-10-27 DIAGNOSIS — Z681 Body mass index (BMI) 19 or less, adult: Secondary | ICD-10-CM | POA: Diagnosis not present

## 2022-10-27 DIAGNOSIS — Z1231 Encounter for screening mammogram for malignant neoplasm of breast: Secondary | ICD-10-CM | POA: Diagnosis not present

## 2022-12-02 DIAGNOSIS — D1801 Hemangioma of skin and subcutaneous tissue: Secondary | ICD-10-CM | POA: Diagnosis not present

## 2022-12-02 DIAGNOSIS — L218 Other seborrheic dermatitis: Secondary | ICD-10-CM | POA: Diagnosis not present

## 2022-12-02 DIAGNOSIS — L821 Other seborrheic keratosis: Secondary | ICD-10-CM | POA: Diagnosis not present

## 2022-12-02 DIAGNOSIS — D2272 Melanocytic nevi of left lower limb, including hip: Secondary | ICD-10-CM | POA: Diagnosis not present

## 2022-12-02 DIAGNOSIS — D2261 Melanocytic nevi of right upper limb, including shoulder: Secondary | ICD-10-CM | POA: Diagnosis not present

## 2023-07-20 DIAGNOSIS — E785 Hyperlipidemia, unspecified: Secondary | ICD-10-CM | POA: Diagnosis not present

## 2023-07-20 DIAGNOSIS — Z0000 Encounter for general adult medical examination without abnormal findings: Secondary | ICD-10-CM | POA: Diagnosis not present

## 2023-07-20 DIAGNOSIS — R945 Abnormal results of liver function studies: Secondary | ICD-10-CM | POA: Diagnosis not present

## 2023-07-20 DIAGNOSIS — R42 Dizziness and giddiness: Secondary | ICD-10-CM | POA: Diagnosis not present

## 2023-07-20 DIAGNOSIS — Z23 Encounter for immunization: Secondary | ICD-10-CM | POA: Diagnosis not present

## 2023-08-19 DIAGNOSIS — R945 Abnormal results of liver function studies: Secondary | ICD-10-CM | POA: Diagnosis not present

## 2023-08-19 DIAGNOSIS — D696 Thrombocytopenia, unspecified: Secondary | ICD-10-CM | POA: Diagnosis not present

## 2023-10-30 DIAGNOSIS — Z01419 Encounter for gynecological examination (general) (routine) without abnormal findings: Secondary | ICD-10-CM | POA: Diagnosis not present

## 2023-10-30 DIAGNOSIS — Z1231 Encounter for screening mammogram for malignant neoplasm of breast: Secondary | ICD-10-CM | POA: Diagnosis not present

## 2023-11-20 DIAGNOSIS — H0288A Meibomian gland dysfunction right eye, upper and lower eyelids: Secondary | ICD-10-CM | POA: Diagnosis not present

## 2023-11-20 DIAGNOSIS — H0288B Meibomian gland dysfunction left eye, upper and lower eyelids: Secondary | ICD-10-CM | POA: Diagnosis not present

## 2023-11-20 DIAGNOSIS — H04123 Dry eye syndrome of bilateral lacrimal glands: Secondary | ICD-10-CM | POA: Diagnosis not present

## 2023-11-20 DIAGNOSIS — H25813 Combined forms of age-related cataract, bilateral: Secondary | ICD-10-CM | POA: Diagnosis not present

## 2024-01-04 DIAGNOSIS — L2089 Other atopic dermatitis: Secondary | ICD-10-CM | POA: Diagnosis not present

## 2024-04-05 DIAGNOSIS — L814 Other melanin hyperpigmentation: Secondary | ICD-10-CM | POA: Diagnosis not present

## 2024-04-05 DIAGNOSIS — L821 Other seborrheic keratosis: Secondary | ICD-10-CM | POA: Diagnosis not present

## 2024-04-05 DIAGNOSIS — D2272 Melanocytic nevi of left lower limb, including hip: Secondary | ICD-10-CM | POA: Diagnosis not present

## 2024-04-05 DIAGNOSIS — D2261 Melanocytic nevi of right upper limb, including shoulder: Secondary | ICD-10-CM | POA: Diagnosis not present

## 2024-04-11 DIAGNOSIS — J189 Pneumonia, unspecified organism: Secondary | ICD-10-CM | POA: Diagnosis not present

## 2024-04-22 DIAGNOSIS — R350 Frequency of micturition: Secondary | ICD-10-CM | POA: Diagnosis not present

## 2024-04-22 DIAGNOSIS — J189 Pneumonia, unspecified organism: Secondary | ICD-10-CM | POA: Diagnosis not present

## 2024-06-02 ENCOUNTER — Emergency Department (HOSPITAL_BASED_OUTPATIENT_CLINIC_OR_DEPARTMENT_OTHER)
Admission: EM | Admit: 2024-06-02 | Discharge: 2024-06-02 | Disposition: A | Discharge status: Home / Self Care | Attending: Emergency Medicine | Admitting: Emergency Medicine

## 2024-06-02 ENCOUNTER — Encounter (HOSPITAL_BASED_OUTPATIENT_CLINIC_OR_DEPARTMENT_OTHER): Payer: Self-pay | Admitting: Emergency Medicine

## 2024-06-02 ENCOUNTER — Emergency Department (HOSPITAL_BASED_OUTPATIENT_CLINIC_OR_DEPARTMENT_OTHER)

## 2024-06-02 ENCOUNTER — Other Ambulatory Visit (HOSPITAL_BASED_OUTPATIENT_CLINIC_OR_DEPARTMENT_OTHER): Payer: Self-pay

## 2024-06-02 ENCOUNTER — Other Ambulatory Visit: Payer: Self-pay

## 2024-06-02 DIAGNOSIS — N281 Cyst of kidney, acquired: Secondary | ICD-10-CM | POA: Diagnosis not present

## 2024-06-02 DIAGNOSIS — Z79899 Other long term (current) drug therapy: Secondary | ICD-10-CM | POA: Diagnosis not present

## 2024-06-02 DIAGNOSIS — R1032 Left lower quadrant pain: Secondary | ICD-10-CM | POA: Diagnosis not present

## 2024-06-02 DIAGNOSIS — K4020 Bilateral inguinal hernia, without obstruction or gangrene, not specified as recurrent: Secondary | ICD-10-CM | POA: Diagnosis not present

## 2024-06-02 DIAGNOSIS — N201 Calculus of ureter: Secondary | ICD-10-CM | POA: Diagnosis not present

## 2024-06-02 DIAGNOSIS — N132 Hydronephrosis with renal and ureteral calculous obstruction: Secondary | ICD-10-CM | POA: Diagnosis not present

## 2024-06-02 LAB — LIPASE, BLOOD: Lipase: 31 U/L (ref 11–51)

## 2024-06-02 LAB — COMPREHENSIVE METABOLIC PANEL WITH GFR
ALT: 29 U/L (ref 0–44)
AST: 27 U/L (ref 15–41)
Albumin: 4.5 g/dL (ref 3.5–5.0)
Alkaline Phosphatase: 60 U/L (ref 38–126)
Anion gap: 13 (ref 5–15)
BUN: 14 mg/dL (ref 8–23)
CO2: 25 mmol/L (ref 22–32)
Calcium: 10.1 mg/dL (ref 8.9–10.3)
Chloride: 100 mmol/L (ref 98–111)
Creatinine, Ser: 0.74 mg/dL (ref 0.44–1.00)
GFR, Estimated: >60 mL/min (ref >60)
Glucose, Bld: 127 mg/dL — ABNORMAL HIGH (ref 70–99)
Potassium: 3.9 mmol/L (ref 3.5–5.1)
Sodium: 138 mmol/L (ref 135–145)
Total Bilirubin: 0.8 mg/dL (ref 0.0–1.2)
Total Protein: 7.3 g/dL (ref 6.5–8.1)

## 2024-06-02 LAB — CBC WITH DIFFERENTIAL/PLATELET
Abs Immature Granulocytes: 0.04 K/uL (ref 0.00–0.07)
Basophils Absolute: 0 K/uL (ref 0.0–0.1)
Basophils Relative: 0 %
Eosinophils Absolute: 0 K/uL (ref 0.0–0.5)
Eosinophils Relative: 0 %
HCT: 40.3 % (ref 36.0–46.0)
Hemoglobin: 13.3 g/dL (ref 12.0–15.0)
Immature Granulocytes: 0 %
Lymphocytes Relative: 7 %
Lymphs Abs: 0.7 K/uL (ref 0.7–4.0)
MCH: 30.3 pg (ref 26.0–34.0)
MCHC: 33 g/dL (ref 30.0–36.0)
MCV: 91.8 fL (ref 80.0–100.0)
Monocytes Absolute: 0.3 K/uL (ref 0.1–1.0)
Monocytes Relative: 4 %
Neutro Abs: 7.8 K/uL — ABNORMAL HIGH (ref 1.7–7.7)
Neutrophils Relative %: 89 %
Platelets: 140 K/uL — ABNORMAL LOW (ref 150–400)
RBC: 4.39 MIL/uL (ref 3.87–5.11)
RDW: 12.7 % (ref 11.5–15.5)
WBC: 8.9 K/uL (ref 4.0–10.5)
nRBC: 0 % (ref 0.0–0.2)

## 2024-06-02 LAB — URINALYSIS, ROUTINE W REFLEX MICROSCOPIC
Bacteria, UA: NONE SEEN
Bilirubin Urine: NEGATIVE
Glucose, UA: NEGATIVE mg/dL
Ketones, ur: 15 mg/dL — AB
Leukocytes,Ua: NEGATIVE
Nitrite: NEGATIVE
Protein, ur: NEGATIVE mg/dL
RBC / HPF: >50 RBC/hpf (ref 0–5)
Specific Gravity, Urine: 1.022 (ref 1.005–1.030)
pH: 5 (ref 5.0–8.0)

## 2024-06-02 MED ORDER — IOHEXOL 300 MG/ML  SOLN
100.0000 mL | Freq: Once | INTRAMUSCULAR | Status: AC | PRN
  Administered 2024-06-02: 100 mL via INTRAVENOUS

## 2024-06-02 MED ORDER — OXYCODONE HCL 5 MG/5ML PO SOLN
5.0000 mg | ORAL | 0 refills | Status: AC | PRN
  Filled 2024-06-02: qty 120, 4d supply, fill #0

## 2024-06-02 MED ORDER — ONDANSETRON HCL 4 MG/2ML IJ SOLN
4.0000 mg | Freq: Once | INTRAMUSCULAR | Status: AC
  Administered 2024-06-02: 4 mg via INTRAVENOUS
  Filled 2024-06-02: qty 2

## 2024-06-02 MED ORDER — FENTANYL CITRATE PF 50 MCG/ML IJ SOSY
50.0000 ug | PREFILLED_SYRINGE | Freq: Once | INTRAMUSCULAR | Status: AC
  Administered 2024-06-02: 50 ug via INTRAVENOUS
  Filled 2024-06-02: qty 1

## 2024-06-02 MED ORDER — OXYCODONE-ACETAMINOPHEN 5-325 MG PO TABS
1.0000 | ORAL_TABLET | Freq: Four times a day (QID) | ORAL | 0 refills | Status: DC | PRN
  Filled 2024-06-02: qty 15, 2d supply, fill #0

## 2024-06-02 MED ORDER — KETOROLAC TROMETHAMINE 15 MG/ML IJ SOLN
15.0000 mg | Freq: Once | INTRAMUSCULAR | Status: AC
  Administered 2024-06-02: 15 mg via INTRAVENOUS
  Filled 2024-06-02: qty 1

## 2024-06-02 MED ORDER — KETOROLAC TROMETHAMINE 15 MG/ML IJ SOLN: 15.0000 mg | Freq: Once | INTRAMUSCULAR | Status: DC

## 2024-06-02 MED ORDER — SODIUM CHLORIDE 0.9 % IV BOLUS
1000.0000 mL | Freq: Once | INTRAVENOUS | Status: AC
  Administered 2024-06-02: 1000 mL via INTRAVENOUS

## 2024-06-02 MED ORDER — TAMSULOSIN HCL 0.4 MG PO CAPS
0.4000 mg | ORAL_CAPSULE | Freq: Every day | ORAL | 0 refills | Status: AC
  Filled 2024-06-02: qty 10, 10d supply, fill #0

## 2024-06-02 MED ORDER — HYDROMORPHONE HCL 1 MG/ML IJ SOLN
1.0000 mg | Freq: Once | INTRAMUSCULAR | Status: DC
  Filled 2024-06-02: qty 1

## 2024-06-02 NOTE — ED Triage Notes (Signed)
 Pt c/o UTI symptoms x 5 days, currently tasking abx prescribed by pcp. Endorses freq. Urination and LT flank pain starting yesterday. Took childrens motrin pta

## 2024-06-02 NOTE — ED Notes (Signed)
 Pt refused dilaudid , requested different non narcotic pain meds. EDP Con[place] notified

## 2024-06-02 NOTE — Discharge Instructions (Addendum)
 Please call and schedule an appointment with [facility] within the next week or so.  I have given you 2 prescriptions.  The first is Percocet which she can take for breakthrough pain.  Have also given you Flomax  which will help open up the ureter to allow the stone to pass through.  You may return to the emergency department for any worsening symptoms.

## 2024-06-02 NOTE — ED Notes (Signed)
 Patient transported to CT

## 2024-06-02 NOTE — ED Provider Notes (Signed)
 [place] EMERGENCY DEPARTMENT AT [facility] Provider Note   CSN: [id number] Arrival date & time: 06/02/24  1237     Patient presents with: Flank Pain   Stephanie Giles is a 63 y.o. female patient with history of diverticulosis who presents to the emergency department today for further evaluation of left lower quadrant abdominal pain, urinary frequency and urgency that started today.  Patient denies any history of kidney stones.  She states the pain slightly radiates to the left side of the abdomen but not to the flank.  She does report associated nausea and vomiting.  She denies any fever, chills, diarrhea.  No dysuria or hematuria.    Flank Pain       Prior to Admission medications   Medication Sig Start Date End Date Taking? Authorizing Provider  cephALEXin (KEFLEX) 250 MG/5ML suspension Take 10 mLs by mouth 3 (three) times daily. 06/01/24  Yes [provider]  oxyCODONE -acetaminophen  (PERCOCET/ROXICET) 5-325 MG tablet Take 1-2 tablets by mouth every 6 (six) hours as needed for severe pain (pain score 7-10). 06/02/24  Yes Theotis, Everley Evora M, PA-C  tamsulosin  (FLOMAX ) 0.4 MG CAPS capsule Take 1 capsule (0.4 mg total) by mouth daily. 06/02/24  Yes Theotis, Avangelina Flight M, PA-C  ALPRAZolam  (XANAX ) 0.25 MG tablet Take 0.25 mg by mouth daily as needed.  06/25/15   [provider]  CALCIUM PO Take by mouth. EZORB POWDER    [provider]  Cholecalciferol (VITAMIN D3) 125 MCG (5000 UT) CHEW Chew by mouth daily.    [provider]  Ferrous Sulfate (IRON PO) Take by mouth. CHEWABLE TAKE ONE DAILY    [provider]  hyoscyamine  (LEVSIN  SL) 0.125 MG SL tablet DISSOLVE 1 TABLET(0.125 MG) UNDER THE TONGUE EVERY 6 HOURS AS NEEDED FOR CRAMPING 07/28/22   Danis, Victory LITTIE MOULD, MD  Ibuprofen (MOTRIN CHILDRENS PO) Take by mouth. SWITCH BETWEEN CHEW AND LIQUID    [provider]  loratadine (CLARITIN) 10 MG tablet Take 10 mg by mouth daily.     [provider]  Multiple Vitamins-Minerals (AIRBORNE GUMMIES PO) Take 1 tablet by mouth daily.    [provider]  Multiple Vitamins-Minerals (MULTIVITAMINS THER. W/MINERALS) TABS Take 1 tablet by mouth daily.    [provider]  OVER THE COUNTER MEDICATION SLEEP CHOCOLATE    [provider]  OVER THE COUNTER MEDICATION as needed. RESTFUL LEGS    [provider]  Probiotic Product (ALIGN) CHEW Chew 1 tablet by mouth daily.    [provider]  Simethicone (GAS-X PO) Take by mouth. Takes as needed    [provider]  valACYclovir (VALTREX) 1000 MG tablet Take 1,000 mg by mouth daily. Takes as needed 06/05/15   [provider]  vitamin C (ASCORBIC ACID) 500 MG tablet Take 500 mg by mouth daily.    [provider]    Allergies: Neosporin [bacitracin-polymyxin b], Azithromycin, Clarithromycin, Sulfa antibiotics, and Prednisone    Review of Systems  Genitourinary:  Positive for flank pain.  All other systems reviewed and are negative.   Updated Vital Signs BP (!) 140/82   Pulse 61   Temp 97.7 F (36.5 C) (Oral)   Resp 18   Ht 5' 4 (1.626 m)   Wt 51.7 kg   LMP 09/02/2012   SpO2 100%   BMI 19.57 kg/m   Physical Exam Vitals and nursing note reviewed.  Constitutional:      General: She is not in acute distress.  Appearance: Normal appearance.  HENT:     Head: Normocephalic and atraumatic.  Eyes:     General:        Right eye: No discharge.        Left eye: No discharge.  Cardiovascular:     Comments: Regular rate and rhythm.  S1/S2 are distinct without any evidence of murmur, rubs, or gallops.  Radial pulses are 2+ bilaterally.  Dorsalis pedis pulses are 2+ bilaterally.  No evidence of pedal edema. Pulmonary:     Comments: Clear to auscultation bilaterally.  Normal effort.  No respiratory distress.  No evidence of wheezes, rales, or rhonchi heard throughout. Abdominal:     General: Abdomen is  flat. Bowel sounds are normal. There is no distension.     Tenderness: There is abdominal tenderness in the left upper quadrant and left lower quadrant. There is left CVA tenderness. There is no guarding or rebound.  Musculoskeletal:        General: Normal range of motion.     Cervical back: Neck supple.  Skin:    General: Skin is warm and dry.     Findings: No rash.  Neurological:     General: No focal deficit present.     Mental Status: She is alert.  Psychiatric:        Mood and Affect: Mood normal.        Behavior: Behavior normal.     (all labs ordered are listed, but only abnormal results are displayed) Labs Reviewed  CBC WITH DIFFERENTIAL/PLATELET - Abnormal; Notable for the following components:      Result Value   Platelets 140 (*)    Neutro Abs 7.8 (*)    All other components within normal limits  COMPREHENSIVE METABOLIC PANEL WITH GFR - Abnormal; Notable for the following components:   Glucose, Bld 127 (*)    All other components within normal limits  URINALYSIS, ROUTINE W REFLEX MICROSCOPIC - Abnormal; Notable for the following components:   Hgb urine dipstick MODERATE (*)    Ketones, ur 15 (*)    All other components within normal limits  LIPASE, BLOOD    EKG: None  Radiology: CT ABDOMEN PELVIS W CONTRAST Result Date: 06/02/2024 CLINICAL DATA:  Left lower quadrant pain EXAM: CT ABDOMEN AND PELVIS WITH CONTRAST TECHNIQUE: Multidetector CT imaging of the abdomen and pelvis was performed using the standard protocol following bolus administration of intravenous contrast. RADIATION DOSE REDUCTION: This exam was performed according to the departmental dose-optimization program which includes automated exposure control, adjustment of the mA and/or kV according to patient size and/or use of iterative reconstruction technique. CONTRAST:  OMNIPAQUE  IOHEXOL  300 MG/ML  SOLN COMPARISON:  CT chest abdomen pelvis December 27, 2019 FINDINGS: Lower chest: Bilateral scarring in  lower lobes. Subpleural nodule measuring 6 mm in peripheral left lower lobe (3/2). Right lung base nodule measuring 4 mm (3/4). These nodules are stable to prior 2021 . Hepatobiliary: No focal liver abnormality is seen. No gallstones, gallbladder wall thickening, or biliary dilatation. Pancreas: Unremarkable. No pancreatic ductal dilatation or surrounding inflammatory changes. Spleen: Normal in size without focal abnormality. Adrenals/Urinary Tract: There is a partially obstructive 7 x 3 mm stone in the left UVJ with moderate to severe left hydroureteronephrosis. Additional nonobstructive nephrolithiasis in left kidney lower pole measuring up to 5 mm. Few subcentimeter bilateral simple renal cortical cysts. No nephrolithiasis or hydronephrosis in a on the right. Adrenal glands are unremarkable. Stomach/Bowel: Stomach is within normal limits. Appendix appears normal. No  evidence of bowel wall thickening, distention, or inflammatory changes. Vascular/Lymphatic: No significant vascular findings are present. No enlarged abdominal or pelvic lymph nodes. Reproductive: Engorged bilateral uterine vessels.  No adnexal mass. Other: Bilateral small fat containing inguinal hernias.  No ascites. Musculoskeletal: No acute or significant osseous findings. IMPRESSION: Left UVJ obstructive nephrolithiasis with moderate to severe left hydroureteronephrosis. Additional nonobstructing nephrolithiasis in left kidney lower pole. Recommend urologic consult. Engorged bilateral uterine vasculature which can be seen the setting of pelvic congestion syndrome. Correlate with clinical findings. Pulmonary nodules up to 7 mm are stable to prior exam dated back to 2021 and likely benign . Recommend follow-up to ensure stability. Electronically Signed   By: Megan  Zare M.D.   On: 06/02/2024 15:10     Procedures   Medications Ordered in the ED  ondansetron  (ZOFRAN ) injection 4 mg (4 mg Intravenous Given 06/02/24 1333)  sodium chloride  0.9 %  bolus 1,000 mL (0 mLs Intravenous Stopped 06/02/24 1545)  fentaNYL  (SUBLIMAZE ) injection 50 mcg (50 mcg Intravenous Given 06/02/24 1333)  iohexol  (OMNIPAQUE ) 300 MG/ML solution 100 mL (100 mLs Intravenous Contrast Given 06/02/24 1427)  ketorolac  (TORADOL ) 15 MG/ML injection 15 mg (15 mg Intravenous Given 06/02/24 1459)    Clinical Course as of 06/02/24 1549  Thu Jun 02, 2024  1542 I spoke with Dr. Nieves with urology who agrees with plan.  Patient is overall nonseptic and she can follow-up in the outpatient setting.  Will have her call and schedule an appointment. [CF]  1544 CBC with Differential(!) Negative.  [CF]  1544 Urinalysis, Routine w reflex microscopic -Urine, Clean Catch(!) No signs of UTI.  [CF]  1544 Comprehensive metabolic panel(!) Negative.  [CF]  1545 CT ABDOMEN PELVIS W CONTRAST There is a 7 mm stone in the left UVJ.  This is likely the cause of her symptoms.  I do agree with the radiologist interpretation. [CF]    Clinical Course User Index [CF] Theotis Cameron HERO, PA-C    Medical Decision Making ADAMARIE IZZO is a 63 y.o. female patient who presents to the emergency department today for further evaluation of left lower quadrant abdominal pain and some urinary urgency and frequency.  Given the patient's history of diverticulosis is certainly a differential.  Ureterolithiasis certainly high as well as the patient is having some waxing and waning symptoms.  Will plan to get some formal imaging, basic labs, urinalysis, and give her some fluids.  Patient is overall nonseptic appearing.  She is hemodynamically stable.  Patient is nonseptic appearing.  Labs are all reassuring.  No signs of UTI.  Will give her some Percocet for breakthrough pain, Flomax , and will have her follow-up with urology for further evaluation.  Strict return precautions were discussed.  She is safe for discharge.  Amount and/or Complexity of Data Reviewed Labs: ordered. Decision-making details  documented in ED Course. Radiology: ordered. Decision-making details documented in ED Course.  Risk Prescription drug management.     Final diagnoses:  Ureterolithiasis    ED Discharge Orders          Ordered    oxyCODONE -acetaminophen  (PERCOCET/ROXICET) 5-325 MG tablet  Every 6 hours PRN        06/02/24 1546    tamsulosin  (FLOMAX ) 0.4 MG CAPS capsule  Daily        06/02/24 1547               Theotis Cameron [place], [place] 06/02/24 1549    Midge Golas, MD 06/03/24 [phone]

## 2024-06-06 DIAGNOSIS — N201 Calculus of ureter: Secondary | ICD-10-CM | POA: Diagnosis not present

## 2024-06-28 DIAGNOSIS — N200 Calculus of kidney: Secondary | ICD-10-CM | POA: Diagnosis not present

## 2024-06-28 DIAGNOSIS — N202 Calculus of kidney with calculus of ureter: Secondary | ICD-10-CM | POA: Diagnosis not present

## 2024-07-25 DIAGNOSIS — D696 Thrombocytopenia, unspecified: Secondary | ICD-10-CM | POA: Diagnosis not present

## 2024-07-25 DIAGNOSIS — Z23 Encounter for immunization: Secondary | ICD-10-CM | POA: Diagnosis not present

## 2024-07-25 DIAGNOSIS — G4700 Insomnia, unspecified: Secondary | ICD-10-CM | POA: Diagnosis not present

## 2024-07-25 DIAGNOSIS — Z0000 Encounter for general adult medical examination without abnormal findings: Secondary | ICD-10-CM | POA: Diagnosis not present

## 2024-07-25 DIAGNOSIS — E559 Vitamin D deficiency, unspecified: Secondary | ICD-10-CM | POA: Diagnosis not present

## 2024-07-25 DIAGNOSIS — E785 Hyperlipidemia, unspecified: Secondary | ICD-10-CM | POA: Diagnosis not present

## 2024-07-25 DIAGNOSIS — R42 Dizziness and giddiness: Secondary | ICD-10-CM | POA: Diagnosis not present

## 2024-07-25 DIAGNOSIS — M810 Age-related osteoporosis without current pathological fracture: Secondary | ICD-10-CM | POA: Diagnosis not present
